# Patient Record
Sex: Male | Born: 1975 | Race: White | Hispanic: No | Marital: Married | Smoking: Never smoker
Health system: Southern US, Community
[De-identification: ages and names within clinical notes are randomized; demographics above are authoritative.]

## PROBLEM LIST (undated history)

## (undated) DIAGNOSIS — G4730 Sleep apnea, unspecified: Secondary | ICD-10-CM

## (undated) HISTORY — PX: DENTAL SURGERY: SHX609

---

## 2000-05-20 ENCOUNTER — Encounter: Payer: Self-pay | Admitting: Emergency Medicine

## 2000-05-20 ENCOUNTER — Emergency Department (HOSPITAL_COMMUNITY): Admission: EM | Admit: 2000-05-20 | Discharge: 2000-05-20 | Payer: Self-pay | Admitting: Emergency Medicine

## 2007-11-14 ENCOUNTER — Emergency Department (HOSPITAL_COMMUNITY): Admission: EM | Admit: 2007-11-14 | Discharge: 2007-11-14 | Payer: Self-pay | Admitting: Emergency Medicine

## 2008-09-05 ENCOUNTER — Emergency Department (HOSPITAL_COMMUNITY): Admission: EM | Admit: 2008-09-05 | Discharge: 2008-09-05 | Payer: Self-pay | Admitting: Emergency Medicine

## 2008-10-04 ENCOUNTER — Emergency Department (HOSPITAL_COMMUNITY): Admission: EM | Admit: 2008-10-04 | Discharge: 2008-10-04 | Payer: Self-pay | Admitting: Emergency Medicine

## 2009-08-14 ENCOUNTER — Emergency Department (HOSPITAL_COMMUNITY): Admission: EM | Admit: 2009-08-14 | Discharge: 2009-08-14 | Payer: Self-pay | Admitting: Emergency Medicine

## 2009-08-29 IMAGING — CR DG FOOT COMPLETE 3+V*L*
3 series · 3 of 3 positions shown · non-contrast
Comparison: None available.

CLINICAL DATA: Pain.  Second toe boil.

LEFT FOOT - COMPLETE 3+ VIEW

[view not recorded (1 of 3)]
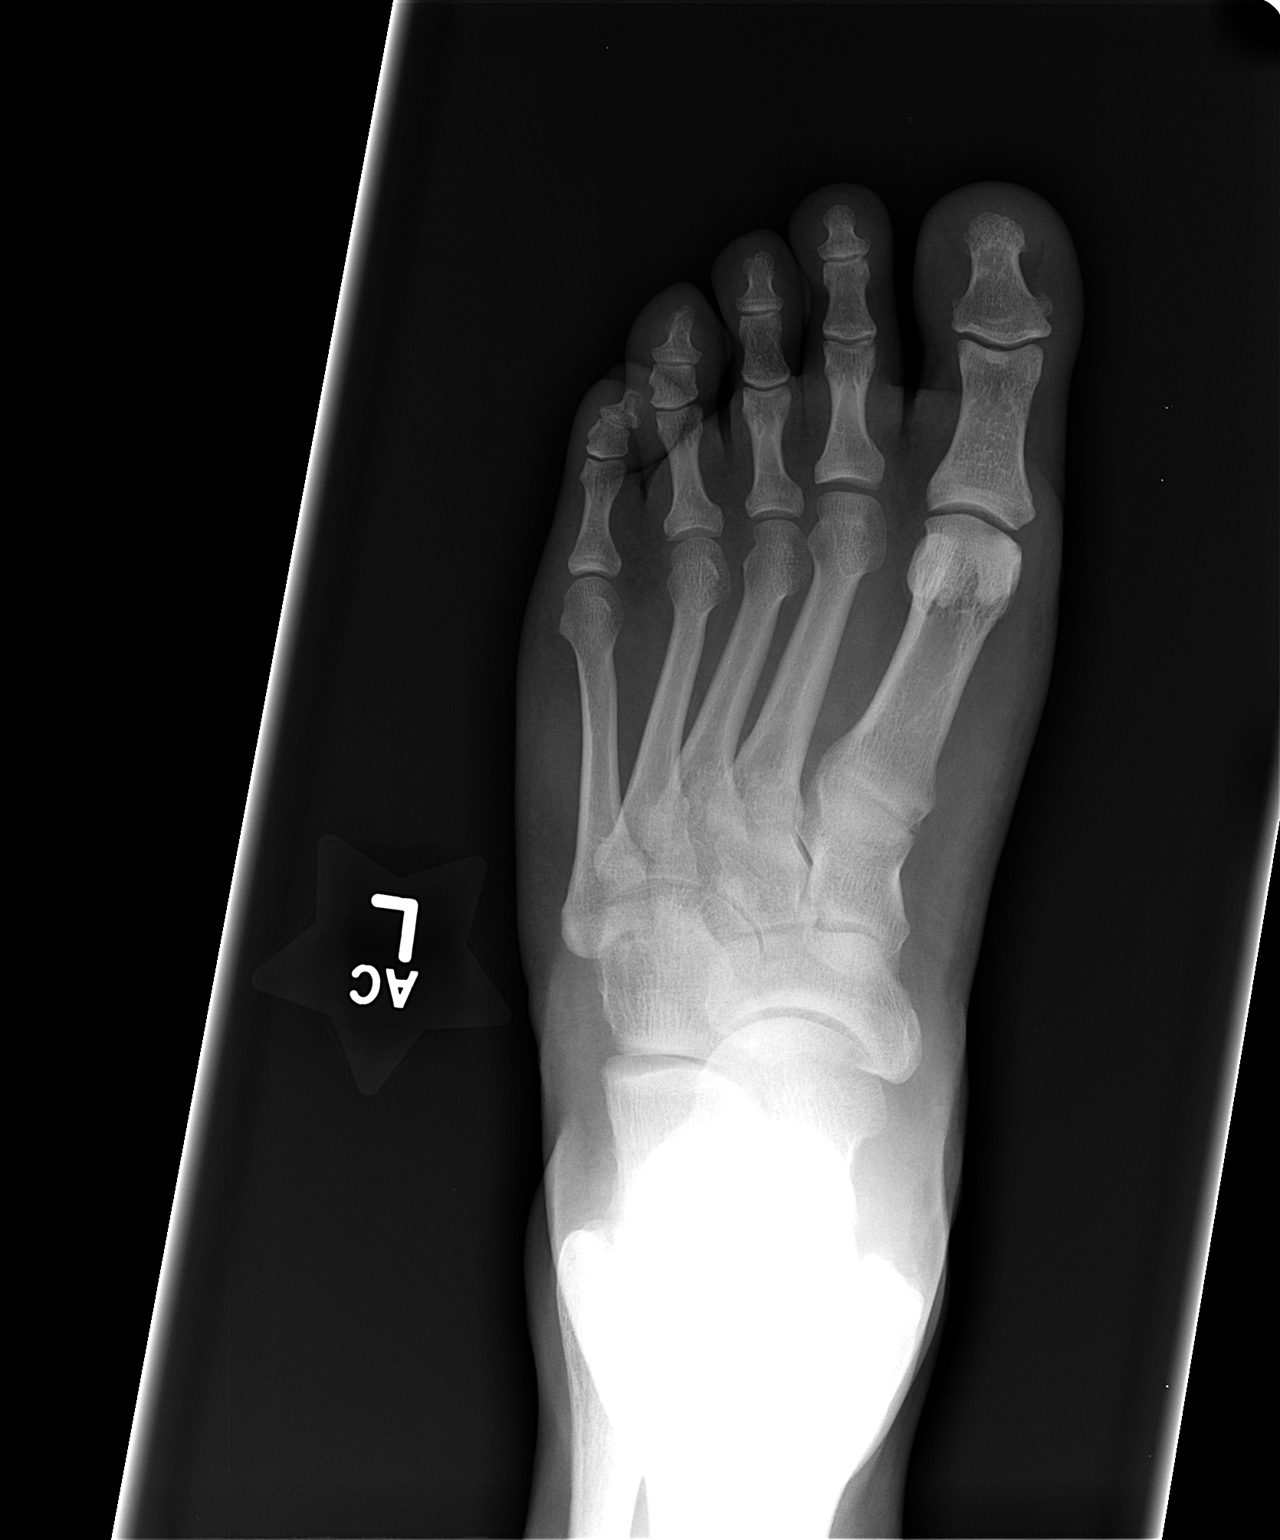

[view not recorded (2 of 3)]
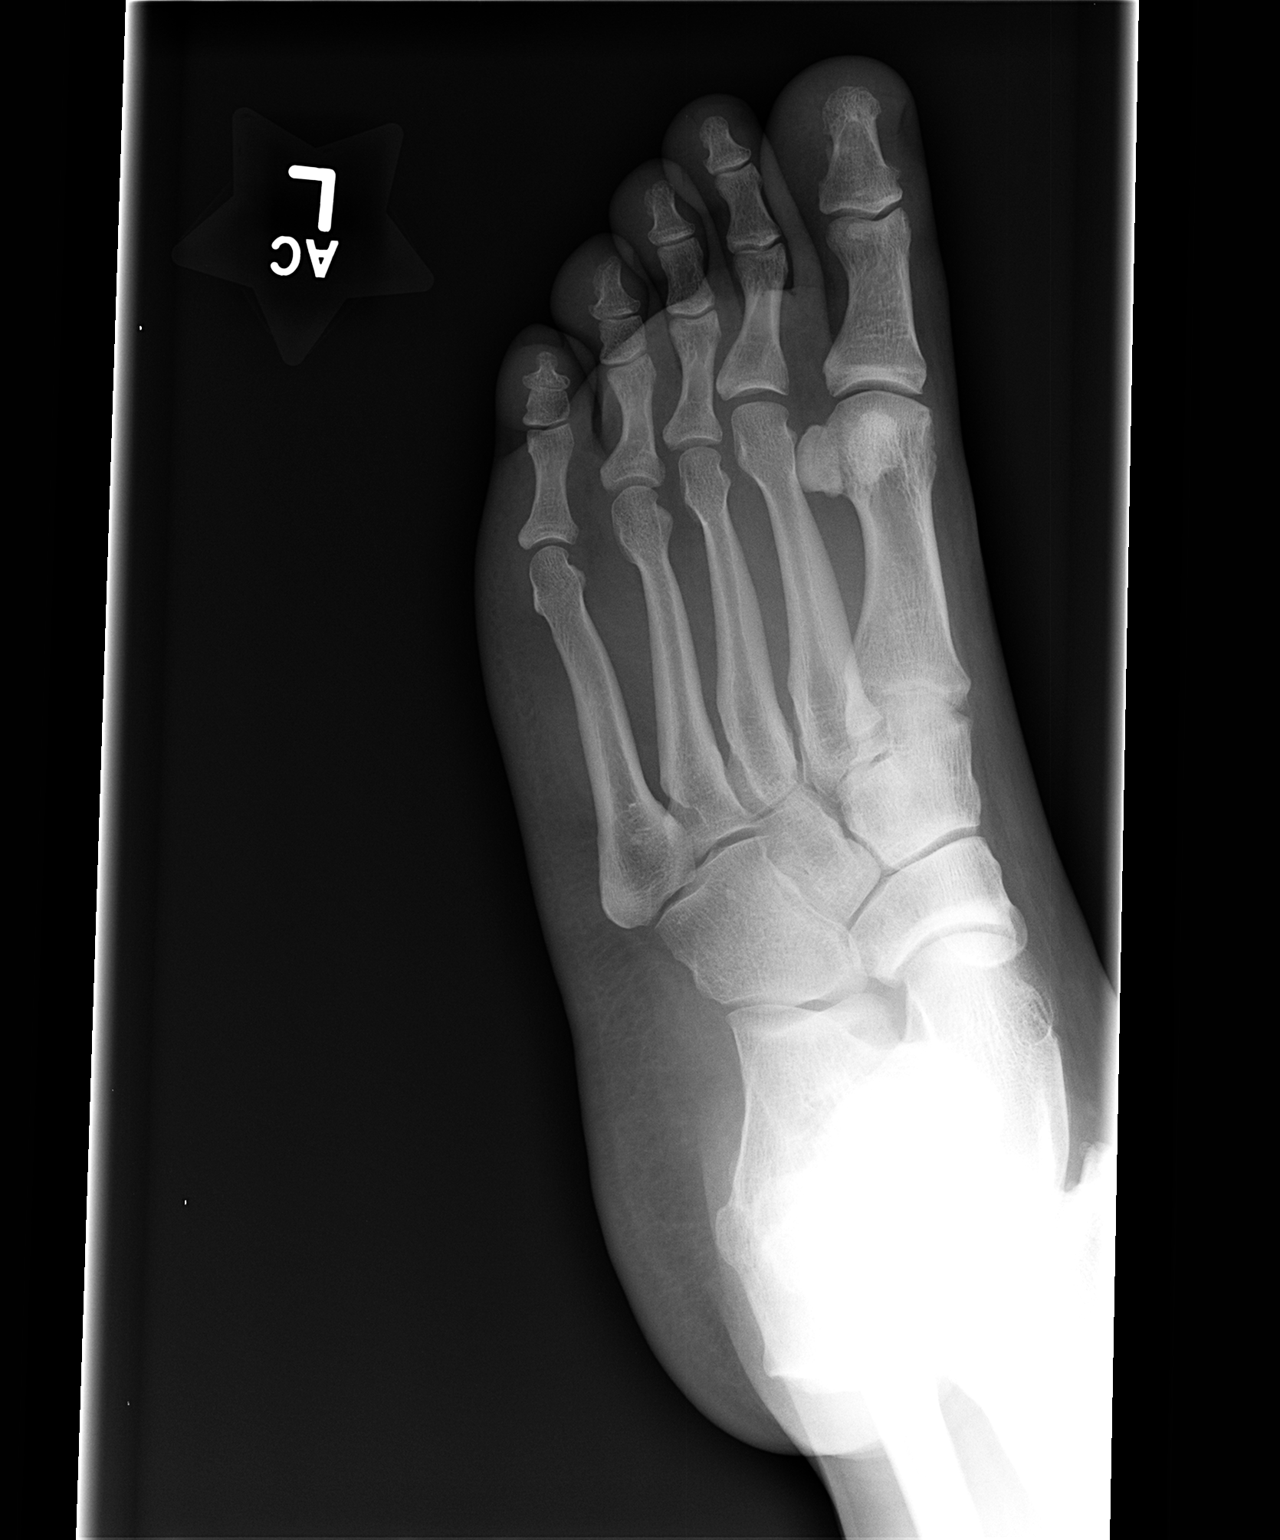

[view not recorded (3 of 3)]
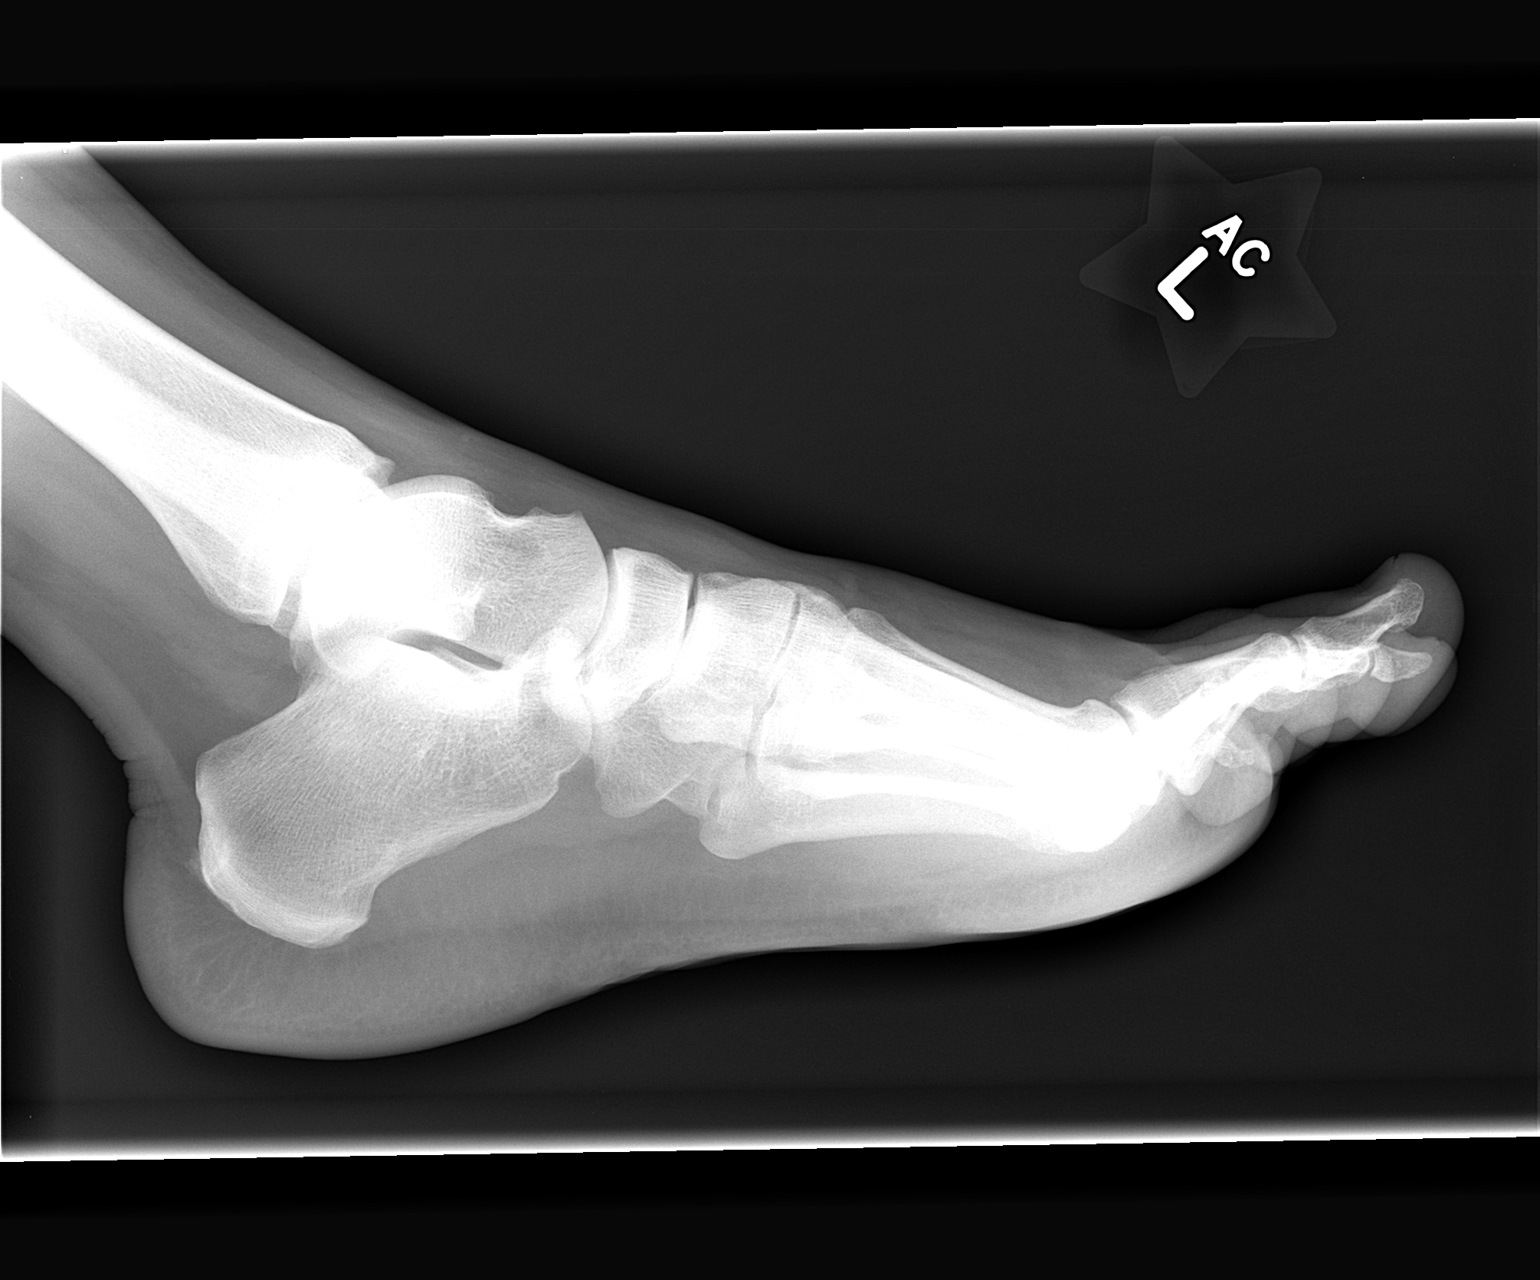

[3 of 3 positions shown; findings below may reference images not displayed]

FINDINGS: There is soft tissue swelling within the distal aspect of
the second digit.  No osseous abnormality is seen.  The remainder
of the foot is unremarkable.
IMPRESSION: 1.  Mild soft tissue swelling within the distal aspect of the
second toe without underlying osseous abnormality.
2.  No other acute abnormality.

## 2011-02-10 LAB — URINALYSIS, ROUTINE W REFLEX MICROSCOPIC
Hgb urine dipstick: NEGATIVE
Ketones, ur: NEGATIVE mg/dL
Protein, ur: NEGATIVE mg/dL
Urobilinogen, UA: 0.2 mg/dL (ref 0.0–1.0)

## 2011-02-10 LAB — RAPID STREP SCREEN (MED CTR MEBANE ONLY): Streptococcus, Group A Screen (Direct): NEGATIVE

## 2011-07-28 LAB — COMPREHENSIVE METABOLIC PANEL
Albumin: 4
BUN: 15
Calcium: 9.6
Creatinine, Ser: 0.92
Glucose, Bld: 93
Potassium: 4.6
Total Protein: 7

## 2011-07-28 LAB — DIFFERENTIAL
Lymphocytes Relative: 41
Lymphs Abs: 3.9
Monocytes Absolute: 0.9
Monocytes Relative: 9
Neutro Abs: 4.6
Neutrophils Relative %: 48

## 2011-07-28 LAB — URINALYSIS, ROUTINE W REFLEX MICROSCOPIC
Bilirubin Urine: NEGATIVE
Glucose, UA: NEGATIVE
Ketones, ur: NEGATIVE
Protein, ur: NEGATIVE

## 2011-07-28 LAB — CBC
HCT: 45
MCHC: 33.8
MCV: 90.5
Platelets: 277
RDW: 13.4

## 2011-08-08 LAB — STREP A DNA PROBE: Group A Strep Probe: NEGATIVE

## 2011-08-08 LAB — RAPID STREP SCREEN (MED CTR MEBANE ONLY): Streptococcus, Group A Screen (Direct): NEGATIVE

## 2011-09-21 ENCOUNTER — Ambulatory Visit: Payer: Self-pay | Admitting: Ophthalmology

## 2011-10-26 ENCOUNTER — Ambulatory Visit: Payer: Self-pay | Admitting: Ophthalmology

## 2013-03-14 ENCOUNTER — Other Ambulatory Visit (HOSPITAL_COMMUNITY): Payer: Self-pay | Admitting: Internal Medicine

## 2013-03-14 DIAGNOSIS — M545 Low back pain, unspecified: Secondary | ICD-10-CM

## 2013-03-14 DIAGNOSIS — N508 Other specified disorders of male genital organs: Secondary | ICD-10-CM

## 2013-03-15 ENCOUNTER — Other Ambulatory Visit (HOSPITAL_COMMUNITY): Payer: Self-pay | Admitting: Internal Medicine

## 2013-03-15 ENCOUNTER — Ambulatory Visit (HOSPITAL_COMMUNITY)
Admission: RE | Admit: 2013-03-15 | Discharge: 2013-03-15 | Disposition: A | Discharge status: Home / Self Care | Payer: BC Managed Care – PPO | Source: Ambulatory Visit | Attending: Internal Medicine | Admitting: Internal Medicine

## 2013-03-15 DIAGNOSIS — N508 Other specified disorders of male genital organs: Secondary | ICD-10-CM

## 2013-03-15 DIAGNOSIS — M545 Low back pain, unspecified: Secondary | ICD-10-CM

## 2013-03-15 DIAGNOSIS — N509 Disorder of male genital organs, unspecified: Secondary | ICD-10-CM | POA: Insufficient documentation

## 2013-03-15 DIAGNOSIS — N433 Hydrocele, unspecified: Secondary | ICD-10-CM | POA: Insufficient documentation

## 2014-02-07 IMAGING — US US SCROTUM
1 series · 14 of 25 positions shown · non-contrast
Comparison: None.

CLINICAL DATA: Left testicular pain, popping sensation

SCROTAL ULTRASOUND
DOPPLER ULTRASOUND OF THE TESTICLES
TECHNIQUE: Complete ultrasound examination of the testicles,
epididymis, and other scrotal structures was performed.  Color and
spectral Doppler ultrasound were also utilized to evaluate blood
flow to the testicles.

[Series 1: us scrotum · 0.07mm/px · 14 of 63 slices shown]
[im 1/63]
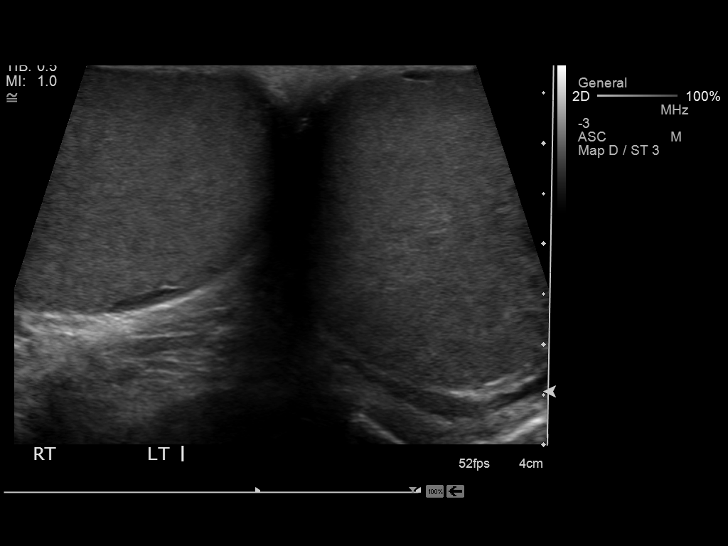
[im 6/63]
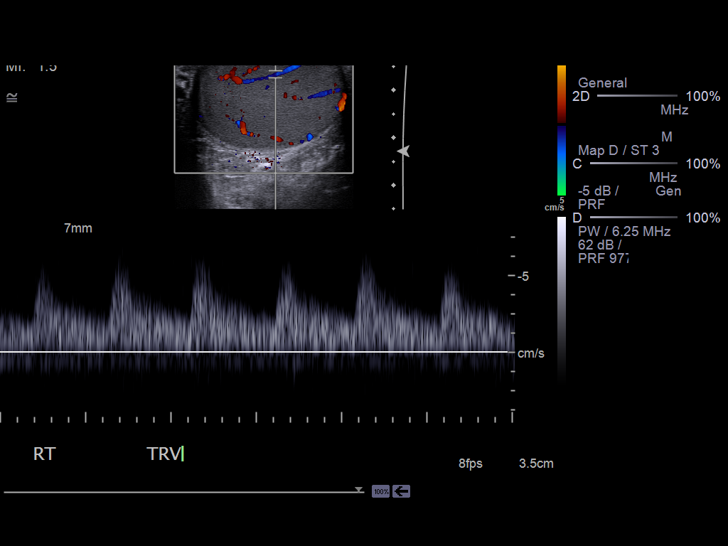
[im 11/63]
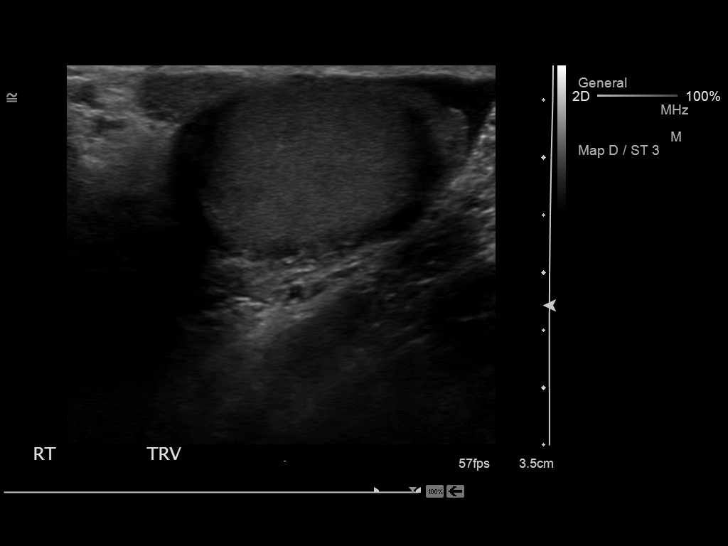
[im 16/63]
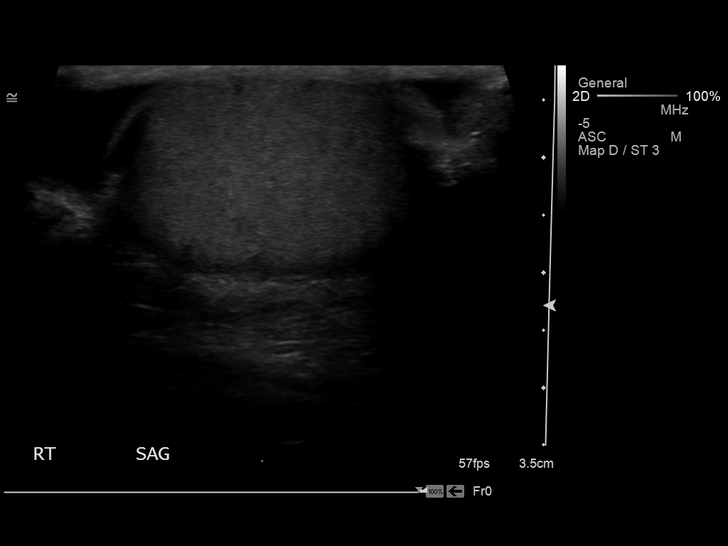
[im 21/63]
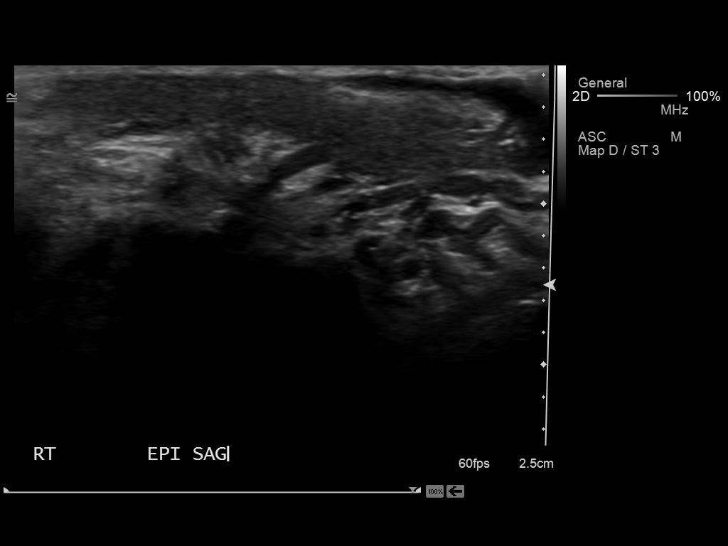
[im 24/63]
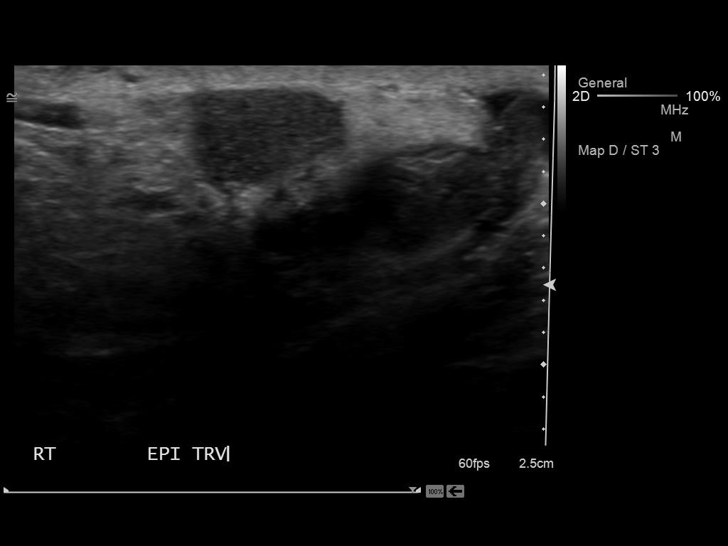
[im 29/63]
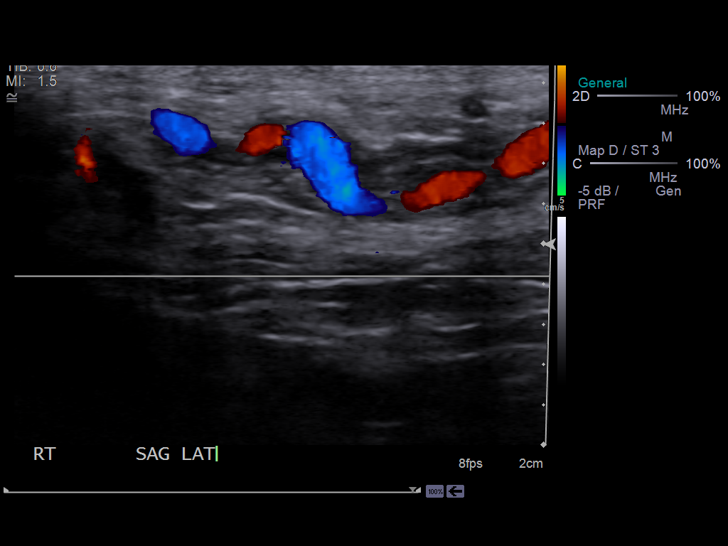
[im 34/63]
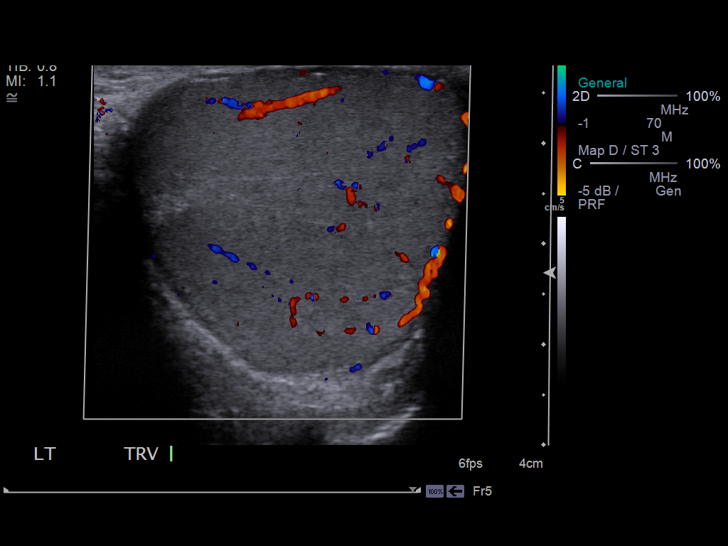
[im 39/63]
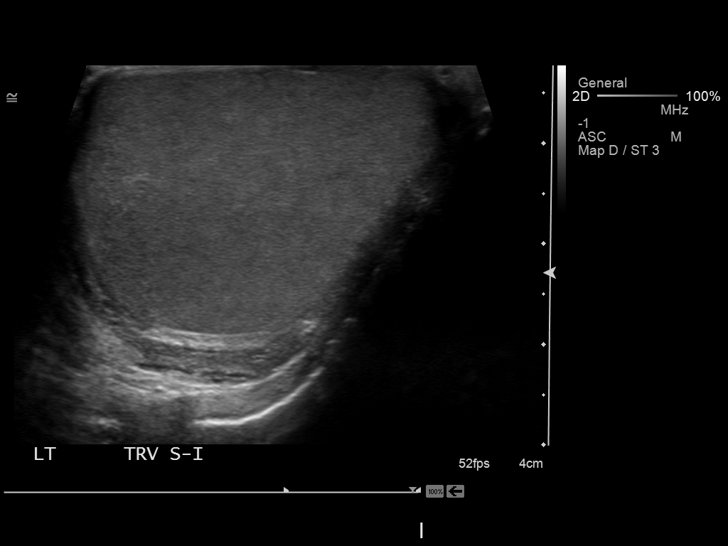
[im 42/63]
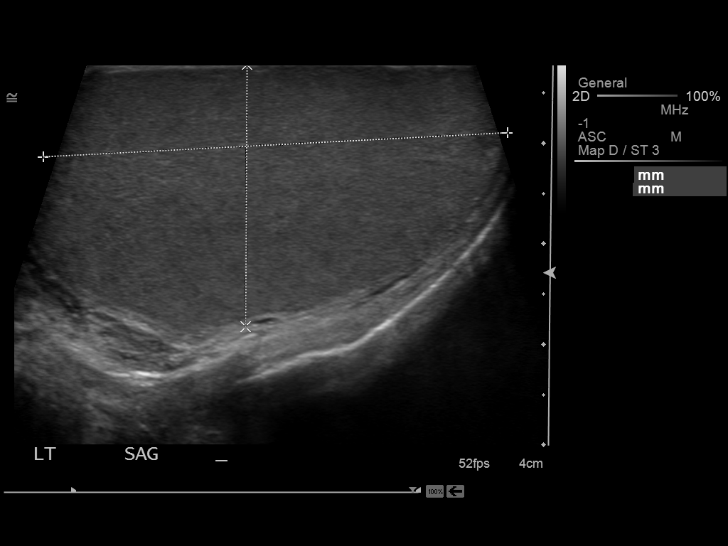
[im 47/63]
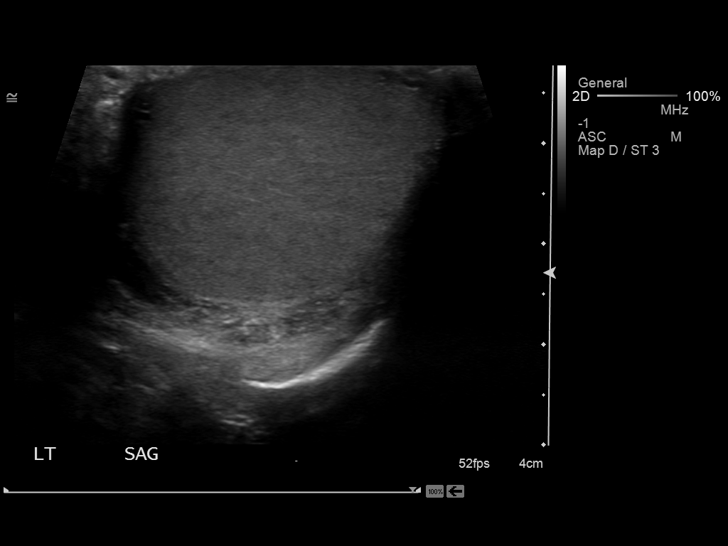
[im 52/63]
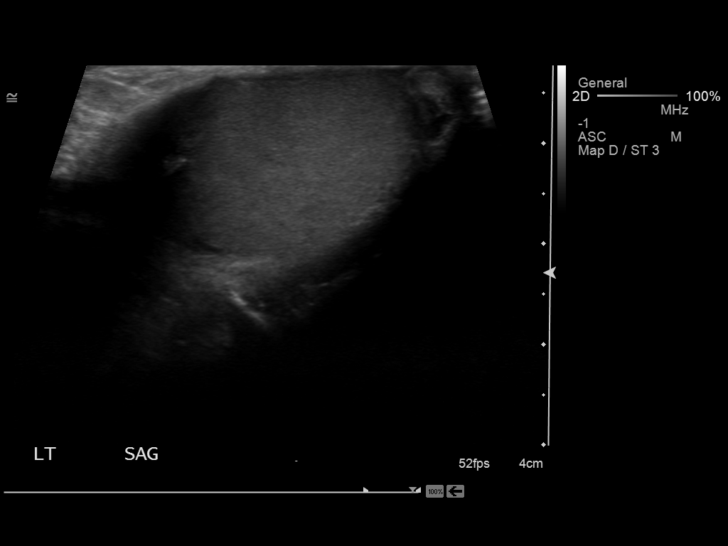
[im 57/63]
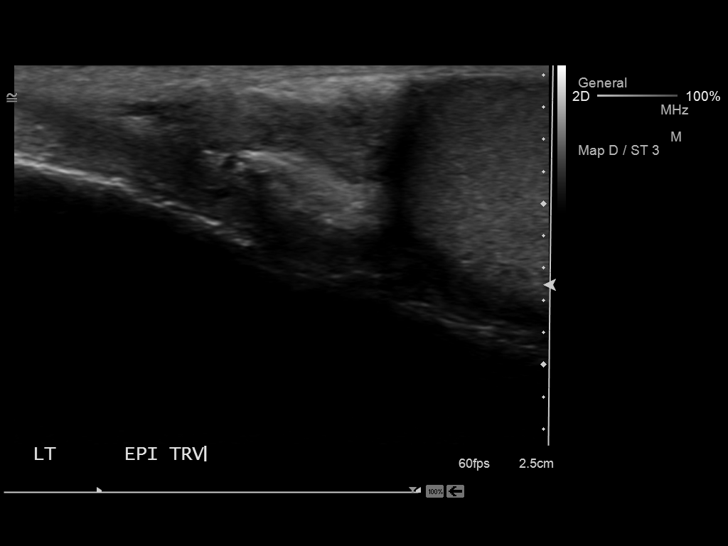
[im 63/63]
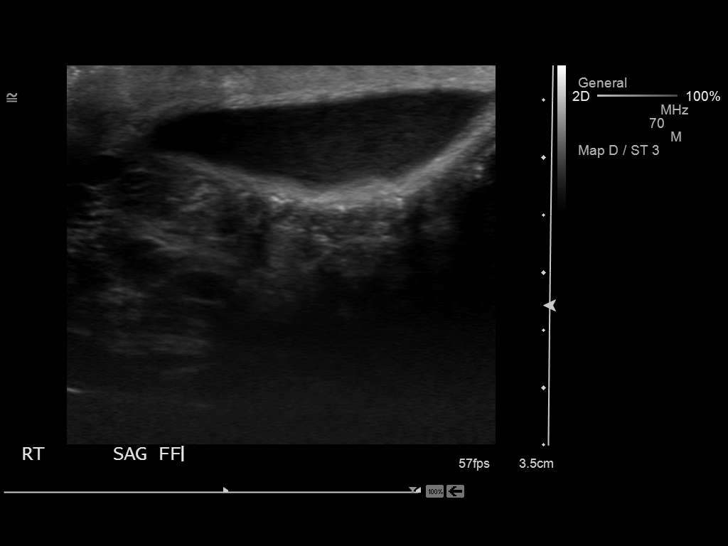

[14 of 25 positions shown; findings below may reference images not displayed]

FINDINGS: Right testis:  Measures 4.6 x 2.5 x 3.5 cm.  Normal homogeneous
echotexture.  Normal color Doppler flow with arterial and venous
waveforms detected.  No focal mass or abnormality.

Left testis:  Measures 4.6 x 2.6 x 3.3 cm.  Normal homogeneous
echotexture.  No focal mass or abnormality.  Normal color Doppler
flow with arterial and venous waveforms detected.

Right epididymis:  Normal in size and appearance.

Left epididymis:  Normal in size and appearance.

Hydrocele:  Very small bilateral hydroceles.

Varicocele:  Not detected

Pulsed Doppler interrogation of both testes demonstrates low
resistance flow bilaterally.
IMPRESSION: Normal scrotal ultrasound and Doppler evaluation.  Negative for
torsion.

## 2014-04-14 ENCOUNTER — Ambulatory Visit (HOSPITAL_COMMUNITY)
Admission: RE | Admit: 2014-04-14 | Discharge: 2014-04-14 | Disposition: A | Discharge status: Home / Self Care | Payer: BC Managed Care – PPO | Source: Ambulatory Visit | Attending: Family Medicine | Admitting: Family Medicine

## 2014-04-14 ENCOUNTER — Other Ambulatory Visit (HOSPITAL_COMMUNITY): Payer: Self-pay | Admitting: Family Medicine

## 2014-04-14 DIAGNOSIS — M7730 Calcaneal spur, unspecified foot: Secondary | ICD-10-CM | POA: Insufficient documentation

## 2014-04-14 DIAGNOSIS — M25579 Pain in unspecified ankle and joints of unspecified foot: Secondary | ICD-10-CM

## 2014-04-14 DIAGNOSIS — M25473 Effusion, unspecified ankle: Secondary | ICD-10-CM | POA: Insufficient documentation

## 2014-04-14 DIAGNOSIS — M25476 Effusion, unspecified foot: Principal | ICD-10-CM | POA: Insufficient documentation

## 2015-03-01 NOTE — Op Note (Signed)
PATIENT NAME:  Spencer Bowman, Spencer Bowman MR#:  [id number] DATE OF BIRTH:  02/04/1976  DATE OF PROCEDURE:  10/26/2011  PROCEDURE PERFORMED: Scleral excision of the right eye.   PREOPERATIVE DIAGNOSIS: Primary uveal effusion syndrome.   POSTOPERATIVE DIAGNOSIS: Primary uveal effusion syndrome.   ESTIMATED BLOOD LOSS: Less than 1 mL.  PRIMARY SURGEON: Ignacia FellingMatthew F. Asheton Viramontes, MD   ANESTHESIA: Retrobulbar block of the right eye with monitored anesthesia care.   COMPLICATIONS: None.   INDICATIONS FOR PROCEDURE: This is a patient who presented to the office with slowly decreasing vision over several years. The patient was noted to have findings in the back of the eye consistent with primary uveal effusion this combined with a smaller eye and thickened sclera. Risks, benefits, and alternatives of the above procedure were discussed and the patient wished to proceed.    DETAILS OF PROCEDURE: After informed consent was obtained, the patient was brought into the operative suite at [facility]. The patient was placed in supine position, was given a small dose of propofol and a retrobulbar block was performed on the right eye by the primary surgeon without any complications. Right eye was prepped and draped in a sterile manner. After lid speculum was inserted, a 360 degree conjunctival peritomy was performed. Tenon's capsule was incised off of the globe in all four quadrants. Each of the four rectus muscles were isolated using 4-0 silks. Each of the four quadrants were then given a partial thickness scleral window using a Beaver blade. Each of the windows was approximately 3 x 3.5 mm. Once each of the scleral windows were completed and hemostasis was confirmed, each of the bridle sutures were removed. The conjunctiva was then brought up and the conjunctiva was closed using running 6-0 plain gut suture. The lid speculum was removed and the eye was cleaned and TobraDex was placed in the eye.   A patch  and shield were placed over the eye and the patient was taken to postanesthesia care with instructions to remain head up.  ____________________________ Ignacia FellingMatthew F. Champ MungoAppenzeller, MD mfa:drc D: 10/26/2011 12:33:32 ET T: 10/26/2011 13:02:57 ET JOB#: [id number]  cc: Ignacia FellingMatthew F. Champ MungoAppenzeller, MD, <Dictator> Cline CoolsMATTHEW F Dariush Mcnellis MD ELECTRONICALLY SIGNED 11/11/2011 17:42

## 2016-09-02 ENCOUNTER — Encounter (HOSPITAL_COMMUNITY): Payer: Self-pay | Admitting: Emergency Medicine

## 2016-09-02 ENCOUNTER — Emergency Department (HOSPITAL_COMMUNITY)
Admission: EM | Admit: 2016-09-02 | Discharge: 2016-09-02 | Disposition: A | Discharge status: Home / Self Care | Payer: BLUE CROSS/BLUE SHIELD | Attending: Emergency Medicine | Admitting: Emergency Medicine

## 2016-09-02 DIAGNOSIS — W540XXA Bitten by dog, initial encounter: Secondary | ICD-10-CM | POA: Diagnosis not present

## 2016-09-02 DIAGNOSIS — Y939 Activity, unspecified: Secondary | ICD-10-CM | POA: Insufficient documentation

## 2016-09-02 DIAGNOSIS — Z79899 Other long term (current) drug therapy: Secondary | ICD-10-CM | POA: Insufficient documentation

## 2016-09-02 DIAGNOSIS — Z792 Long term (current) use of antibiotics: Secondary | ICD-10-CM | POA: Insufficient documentation

## 2016-09-02 DIAGNOSIS — Y929 Unspecified place or not applicable: Secondary | ICD-10-CM | POA: Diagnosis not present

## 2016-09-02 DIAGNOSIS — Y999 Unspecified external cause status: Secondary | ICD-10-CM | POA: Diagnosis not present

## 2016-09-02 DIAGNOSIS — S61252A Open bite of right middle finger without damage to nail, initial encounter: Secondary | ICD-10-CM | POA: Diagnosis not present

## 2016-09-02 MED ORDER — AMOXICILLIN-POT CLAVULANATE 875-125 MG PO TABS: 1.0000 | ORAL_TABLET | Freq: Two times a day (BID) | ORAL | 0 refills | Status: DC

## 2016-09-02 NOTE — ED Triage Notes (Signed)
Pt reports animal bite to left middle finger. Animal control aware and has quarantined dog. The dog is not up to date on shots according to owners.

## 2016-09-02 NOTE — ED Notes (Signed)
Spoke with Minerva AreolaEric from [employer] [place], dog is quarantined and there is no proof of vaccines.

## 2016-09-02 NOTE — Discharge Instructions (Signed)
Clean the wound with mild soap and water.  Return here for any signs of infection such as increasing pain, redness or swelling

## 2016-09-06 NOTE — ED Provider Notes (Signed)
[facility]-EMER[facility]Y DEPT Provider Note   CSN: [id number] Arrival date & time: 09/02/16  1412     History   Chief Complaint Chief Complaint  Patient presents with  . Animal Bite    HPI Spencer GourdMatthew M Cloninger is a 40 y.o. male.  HPI  Spencer Bowman is a 40 y.o. male who presents to the Emergency Department requesting evaluation for a dog bite that occurred one day prior to arrival.  Patient states that he and his son were trying to separate two dogs that were fighting and he was bitten on the left middle finger.  Wounds were cleaned with peroxide and water.  Greater than 24 hrs old.  Immunizations are current. He states the dog is currently quarantined. He denies redness, swelling and numbness of the finger    History reviewed. No pertinent past medical history.  There are no active problems to display for this patient.   Past Surgical History:  Procedure Laterality Date  . DENTAL SURGERY         Home Medications    Prior to Admission medications   Medication Sig Start Date End Date Taking? Authorizing Provider  Multiple Vitamin (ONE-A-DAY MENS PO) Take 1 tablet by mouth daily.   Yes Historical Provider, MD  amoxicillin-clavulanate (AUGMENTIN) 875-125 MG tablet Take 1 tablet by mouth every 12 (twelve) hours. 09/02/16   Arlo Butt, PA-C    Family History No family history on file.  Social History Social History  Substance Use Topics  . Smoking status: Never Smoker  . Smokeless tobacco: Never Used  . Alcohol use No     Allergies   Review of patient's allergies indicates no known allergies.   Review of Systems Review of Systems  Constitutional: Negative for chills and fever.  Musculoskeletal: Negative for arthralgias, back pain and joint swelling.  Skin: Positive for wound.       Dog bite to left middle finger  Neurological: Negative for dizziness, weakness and numbness.  Hematological: Does not bruise/bleed easily.  All other systems reviewed and are  negative.    Physical Exam Updated Vital Signs BP (!) 166/111 (BP Location: Left Arm)   Pulse 82   Temp 97.9 F (36.6 C) (Oral)   Resp 20   Ht 5\' 9"  (1.753 m)   Wt 122.5 kg   SpO2 97%   BMI 39.87 kg/m   Physical Exam  Constitutional: He is oriented to person, place, and time. He appears well-developed and well-nourished. No distress.  HENT:  Head: Atraumatic.  Cardiovascular: Normal rate, regular rhythm and intact distal pulses.   Pulmonary/Chest: Effort normal. No respiratory distress.  Musculoskeletal: Normal range of motion. He exhibits tenderness. He exhibits no edema or deformity.  superificial 1.5 cm wound to the left distal middle finger.  No erythema, edema, lymphangitis.  Pt has FROM of the finger.  Sensation intact  Neurological: He is alert and oriented to person, place, and time.  Skin: Skin is warm and dry. Capillary refill takes less than 2 seconds. No rash noted. No erythema.  Nursing note and vitals reviewed.    ED Treatments / Results  Labs (all labs ordered are listed, but only abnormal results are displayed) Labs Reviewed - No data to display  EKG  EKG Interpretation None       Radiology No results found.  Procedures Procedures (including critical care time)  Medications Ordered in ED Medications - No data to display   Initial Impression / Assessment and Plan / ED Course  I have reviewed the triage vital signs and the nursing notes.  Pertinent labs & imaging results that were available during my care of the patient were reviewed by me and considered in my medical decision making (see chart for details).  Clinical Course   Superficial wound to the left middle finger.  NV and motor intact  No signs of infection currently.  Dog is quarantined and this was verified by Minerva AreolaEric, [employer] officer with [employer].  Pt understands that rabies vaccines are not indicated at present.    Td is up to date.  Final Clinical  Impressions(s) / ED Diagnoses   Final diagnoses:  Dog bite, initial encounter    New Prescriptions Discharge Medication List as of 09/02/2016  4:26 PM    START taking these medications   Details  amoxicillin-clavulanate (AUGMENTIN) 875-125 MG tablet Take 1 tablet by mouth every 12 (twelve) hours., Starting Fri 09/02/2016, Print         Aariel Ems Amherstriplett, PA-C 09/06/16 [id number]    Lavera Guiseana Duo Liu, MD 09/07/16 [id number]

## 2016-12-28 ENCOUNTER — Ambulatory Visit: Payer: Self-pay | Admitting: Otolaryngology

## 2016-12-28 NOTE — H&P (Signed)
PREOPERATIVE H&P  Chief Complaint: obstructive sleep apnea  HPI: Spencer Bowman is a 41 y.o. male who presents for evaluation of OSA. He's always snored but more recently his wife states he stops breathing at night. Sleep test demonstrated severe sleep apnea with RDI of 48 and O2 sat down to mid 70's. He refuses to wear nasal CPAP and wants to try surgery and lose weight although I feel like nasal CPAP would be more effective. He does have moderate septal deviation and large turbinates as well as still having tonsils and a long uvula. Surgery will be beneficial to him. He would like to proceed with the surgery.  No past medical history on file. Past Surgical History:  Procedure Laterality Date  . DENTAL SURGERY     Social History   Social History  . Marital status: Married    Spouse name: N/A  . Number of children: N/A  . Years of education: N/A   Social History Main Topics  . Smoking status: Never Smoker  . Smokeless tobacco: Never Used  . Alcohol use No  . Drug use: No  . Sexual activity: Not on file   Other Topics Concern  . Not on file   Social History Narrative  . No narrative on file   No family history on file. No Known Allergies Prior to Admission medications   Medication Sig Start Date End Date Taking? Authorizing Provider  Omega-3 Fatty Acids (FISH OIL) 1000 MG CAPS Take 1,000 mg by mouth 2 (two) times daily.   Yes Historical Provider, MD  amoxicillin-clavulanate (AUGMENTIN) 875-125 MG tablet Take 1 tablet by mouth every 12 (twelve) hours. Patient not taking: Reported on 12/26/2016 09/02/16   Tammy Triplett, PA-C     Positive ROS: per HPI  All other systems have been reviewed and were otherwise negative with the exception of those mentioned in the HPI and as above.  Physical Exam: There were no vitals filed for this visit.  General: Alert, no acute distress Oral: Normal oral mucosa and tonsils, long uvula, slightly retrognathic Nasal: deviated septum and  large turbinates, no polyps or masses Neck: No palpable adenopathy or thyroid nodules Ear: Ear canal is clear with normal appearing TMs Cardiovascular: Regular rate and rhythm, no murmur.  Respiratory: Clear to auscultation Neurologic: Alert and oriented x 3   Assessment/Plan: obstructive sleep apnea Plan for Procedure(s): NASAL SEPTOPLASTY WITH BILATERAL TURBINATE REDUCTION UVULOPALATOPHARYNGOPLASTY (UPPP)   Dillard CannonHRISTOPHER Elam Ellis, MD 12/28/2016 12:55 PM

## 2016-12-30 ENCOUNTER — Encounter (HOSPITAL_COMMUNITY): Payer: Self-pay

## 2016-12-30 ENCOUNTER — Encounter (HOSPITAL_COMMUNITY)
Admission: RE | Admit: 2016-12-30 | Discharge: 2016-12-30 | Disposition: A | Discharge status: Home / Self Care | Payer: BLUE CROSS/BLUE SHIELD | Source: Ambulatory Visit | Attending: Otolaryngology | Admitting: Otolaryngology

## 2016-12-30 DIAGNOSIS — R0683 Snoring: Secondary | ICD-10-CM | POA: Insufficient documentation

## 2016-12-30 DIAGNOSIS — Z01812 Encounter for preprocedural laboratory examination: Secondary | ICD-10-CM | POA: Insufficient documentation

## 2016-12-30 DIAGNOSIS — G4733 Obstructive sleep apnea (adult) (pediatric): Secondary | ICD-10-CM | POA: Diagnosis not present

## 2016-12-30 HISTORY — DX: Sleep apnea, unspecified: G47.30

## 2016-12-30 LAB — CBC
HCT: 47.4 % (ref 39.0–52.0)
Hemoglobin: 16.7 g/dL (ref 13.0–17.0)
MCH: 31.9 pg (ref 26.0–34.0)
MCHC: 35.2 g/dL (ref 30.0–36.0)
MCV: 90.5 fL (ref 78.0–100.0)
PLATELETS: 325 10*3/uL (ref 150–400)
RBC: 5.24 MIL/uL (ref 4.22–5.81)
RDW: 12.4 % (ref 11.5–15.5)
WBC: 10.7 10*3/uL — ABNORMAL HIGH (ref 4.0–10.5)

## 2016-12-30 NOTE — Pre-Procedure Instructions (Addendum)
Spencer SellaMatthew M Bowman  12/30/2016      [facility] - [place] - [address] [place] [id number] Phone: [phone] Fax: [phone]    Your procedure is scheduled on *01/06/17  [facility] Admitting at 530 A.M.  Call this number if you have problems the morning of surgery:  [phone]   Remember:  Do not eat food or drink liquids after midnight.  Take these medicines the morning of surgery with A SIP OF WATER    NONE  STOP all herbel meds, nsaids (aleve,naproxen,advil,ibuprofen)  Starting Today including all vitamins/supplements(fish oil),aspirin   Do not wear jewelry, make-up or nail polish.  Do not wear lotions, powders, or perfumes, or deoderant.  Do not shave 48 hours prior to surgery.  Men may shave face and neck.  Do not bring valuables to the hospital.  [facility] is not responsible for any belongings or valuables.  Contacts, dentures or bridgework may not be worn into surgery.  Leave your suitcase in the car.  After surgery it may be brought to your room.  For patients admitted to the hospital, discharge time will be determined by your treatment team.  Patients discharged the day of surgery will not be allowed to drive home.   Special instructions:   Special Instructions: [name] - Preparing for Surgery  Before surgery, you can play an important role.  Because skin is not sterile, your skin needs to be as free of germs as possible.  You can reduce the number of germs on you skin by washing with CHG (chlorahexidine gluconate) soap before surgery.  CHG is an antiseptic cleaner which kills germs and [place] with the skin to continue killing germs even after washing.  Please DO NOT use if you have an allergy to CHG or antibacterial soaps.  If your skin becomes reddened/irritated stop using the CHG and inform your nurse when you arrive at Short Stay.  Do not shave (including legs and underarms) for at least 48  hours prior to the first CHG shower.  You may shave your face.  Please follow these instructions carefully:   1.  Shower with CHG Soap the night before surgery and the morning of Surgery.  2.  If you choose to wash your hair, wash your hair first as usual with your normal shampoo.  3.  After you shampoo, rinse your hair and body thoroughly to remove the Shampoo.  4.  Use CHG as you would any other liquid soap.  You can apply chg directly  to the skin and wash gently with scrungie or a clean washcloth.  5.  Apply the CHG Soap to your body ONLY FROM THE NECK DOWN.  Do not use on open wounds or open sores.  Avoid contact with your eyes ears, mouth and genitals (private parts).  Wash genitals (private parts)       with your normal soap.  6.  Wash thoroughly, paying special attention to the area where your surgery will be performed.  7.  Thoroughly rinse your body with warm water from the neck down.  8.  DO NOT shower/wash with your normal soap after using and rinsing off the CHG Soap.  9.  Pat yourself dry with a clean towel.            10.  Wear clean pajamas.            11.  Place  clean sheets on your bed the night of your first shower and do not sleep with pets.  Day of Surgery  Do not apply any lotions/deodorants the morning of surgery.  Please wear clean clothes to the hospital/surgery center.  Please read over the  fact sheets that you were given.

## 2016-12-30 NOTE — Pre-Procedure Instructions (Signed)
Spencer Bowman  12/30/2016      [facility] - [place], [place] - [address] [place] [address] Phone: [phone] Fax: [phone]    Your procedure is scheduled on Fri. Mar. 2  Report to [facility] Admitting at 5:30 A.M.  Call this number if you have problems the morning of surgery:  [phone]   Remember:  Do not eat food or drink liquids after midnight.  Take these medicines the morning of surgery with A SIP OF WATER  : none              1 week prior to surgery stop advil, motrin, aleve, ibuprofen, BC Powders, goody's, fish oil vitamins/herbal medicines.   Do not wear jewelry.  Do not wear lotions, powders, or cologne, or deoderant.  Do not shave 48 hours prior to surgery.  Men may shave face and neck.  Do not bring valuables to the hospital.  [facility] is not responsible for any belongings or valuables.  Contacts, dentures or bridgework may not be worn into surgery.  Leave your suitcase in the car.  After surgery it may be brought to your room.  For patients admitted to the hospital, discharge time will be determined by your treatment team.  Patients discharged the day of surgery will not be allowed to drive home.   Name and phone number of your driver:   Special instructions:   [place]- Preparing For Surgery  Before surgery, you can play an important role. Because skin is not sterile, your skin needs to be as free of germs as possible. You can reduce the number of germs on your skin by washing with CHG (chlorahexidine gluconate) Soap before surgery.  CHG is an antiseptic cleaner which kills germs and [place] with the skin to continue killing germs even after washing.  Please do not use if you have an allergy to CHG or antibacterial soaps. If your skin becomes reddened/irritated stop using the CHG.  Do not shave (including legs and underarms) for at least 48 hours prior to first CHG shower. It is OK to shave your  face.  Please follow these instructions carefully.   1. Shower the NIGHT BEFORE SURGERY and the MORNING OF SURGERY with CHG.   2. If you chose to wash your hair, wash your hair first as usual with your normal shampoo.  3. After you shampoo, rinse your hair and body thoroughly to remove the shampoo.  4. Use CHG as you would any other liquid soap. You can apply CHG directly to the skin and wash gently with a scrungie or a clean washcloth.   5. Apply the CHG Soap to your body ONLY FROM THE NECK DOWN.  Do not use on open wounds or open sores. Avoid contact with your eyes, ears, mouth and genitals (private parts). Wash genitals (private parts) with your normal soap.  6. Wash thoroughly, paying special attention to the area where your surgery will be performed.  7. Thoroughly rinse your body with warm water from the neck down.  8. DO NOT shower/wash with your normal soap after using and rinsing off the CHG Soap.  9. Pat yourself dry with a CLEAN TOWEL.   10. Wear CLEAN PAJAMAS   11. Place CLEAN SHEETS on your bed the night of your first shower and DO NOT SLEEP WITH PETS.    Day of Surgery: Do not apply any deodorants/lotions. Please wear clean  clothes to the hospital/surgery center.      Please read over the following fact sheets that you were given. Coughing and Deep Breathing

## 2017-01-05 MED ORDER — DEXTROSE 5 % IV SOLN
3.0000 g | INTRAVENOUS | Status: AC
  Administered 2017-01-06: 3 g via INTRAVENOUS
  Filled 2017-01-05: qty 3000

## 2017-01-06 ENCOUNTER — Ambulatory Visit (HOSPITAL_COMMUNITY): Payer: BLUE CROSS/BLUE SHIELD | Admitting: Certified Registered Nurse Anesthetist

## 2017-01-06 ENCOUNTER — Observation Stay (HOSPITAL_COMMUNITY)
Admission: RE | Admit: 2017-01-06 | Discharge: 2017-01-07 | Disposition: A | Discharge status: Home / Self Care | Payer: BLUE CROSS/BLUE SHIELD | Source: Ambulatory Visit | Attending: Otolaryngology | Admitting: Otolaryngology

## 2017-01-06 ENCOUNTER — Encounter (HOSPITAL_COMMUNITY): Payer: Self-pay

## 2017-01-06 ENCOUNTER — Encounter (HOSPITAL_COMMUNITY)
Admission: RE | Disposition: A | Discharge status: Home / Self Care | Payer: Self-pay | Source: Ambulatory Visit | Attending: Otolaryngology

## 2017-01-06 DIAGNOSIS — J3489 Other specified disorders of nose and nasal sinuses: Secondary | ICD-10-CM | POA: Diagnosis not present

## 2017-01-06 DIAGNOSIS — Z6839 Body mass index (BMI) 39.0-39.9, adult: Secondary | ICD-10-CM | POA: Diagnosis not present

## 2017-01-06 DIAGNOSIS — G4733 Obstructive sleep apnea (adult) (pediatric): Principal | ICD-10-CM | POA: Diagnosis present

## 2017-01-06 DIAGNOSIS — J343 Hypertrophy of nasal turbinates: Secondary | ICD-10-CM | POA: Diagnosis not present

## 2017-01-06 DIAGNOSIS — J342 Deviated nasal septum: Secondary | ICD-10-CM | POA: Insufficient documentation

## 2017-01-06 HISTORY — PX: NASAL SEPTOPLASTY W/ TURBINOPLASTY: SHX2070

## 2017-01-06 HISTORY — PX: UVULOPALATOPHARYNGOPLASTY: SHX827

## 2017-01-06 SURGERY — SEPTOPLASTY, NOSE, WITH NASAL TURBINATE REDUCTION
Anesthesia: General | Site: Nose

## 2017-01-06 MED ORDER — ONDANSETRON HCL 4 MG PO TABS
4.0000 mg | ORAL_TABLET | ORAL | Status: DC | PRN
Start: 2017-01-06 — End: 2017-01-07

## 2017-01-06 MED ORDER — MIDAZOLAM HCL 5 MG/5ML IJ SOLN
INTRAMUSCULAR | Status: DC | PRN
  Administered 2017-01-06: 2 mg via INTRAVENOUS

## 2017-01-06 MED ORDER — HYDROCODONE-ACETAMINOPHEN 7.5-325 MG/15ML PO SOLN
10.0000 mL | ORAL | Status: DC | PRN
  Administered 2017-01-06 – 2017-01-07 (×5): 15 mL via ORAL
  Filled 2017-01-06 (×5): qty 15

## 2017-01-06 MED ORDER — 0.9 % SODIUM CHLORIDE (POUR BTL) OPTIME
TOPICAL | Status: DC | PRN
  Administered 2017-01-06: 1000 mL

## 2017-01-06 MED ORDER — LIDOCAINE-EPINEPHRINE (PF) 1 %-1:200000 IJ SOLN
INTRAMUSCULAR | Status: DC | PRN
  Administered 2017-01-06: 10 mL

## 2017-01-06 MED ORDER — CEFAZOLIN IN D5W 1 GM/50ML IV SOLN
1.0000 g | Freq: Three times a day (TID) | INTRAVENOUS | Status: DC
  Administered 2017-01-06 – 2017-01-07 (×3): 1 g via INTRAVENOUS
  Filled 2017-01-06 (×4): qty 50

## 2017-01-06 MED ORDER — MORPHINE SULFATE (PF) 2 MG/ML IV SOLN
2.0000 mg | INTRAVENOUS | Status: DC | PRN
  Administered 2017-01-06 (×2): 2 mg via INTRAVENOUS
  Filled 2017-01-06 (×2): qty 1

## 2017-01-06 MED ORDER — KCL IN DEXTROSE-NACL 20-5-0.45 MEQ/L-%-% IV SOLN
INTRAVENOUS | Status: DC
  Administered 2017-01-06: 100 mL/h via INTRAVENOUS
  Administered 2017-01-07: 02:00:00 via INTRAVENOUS
  Filled 2017-01-06 (×3): qty 1000

## 2017-01-06 MED ORDER — BACITRACIN ZINC 500 UNIT/GM EX OINT
TOPICAL_OINTMENT | CUTANEOUS | Status: AC
  Filled 2017-01-06: qty 28.35

## 2017-01-06 MED ORDER — ACETAMINOPHEN 10 MG/ML IV SOLN
INTRAVENOUS | Status: AC
  Filled 2017-01-06: qty 100

## 2017-01-06 MED ORDER — MEPERIDINE HCL 25 MG/ML IJ SOLN: 6.2500 mg | INTRAMUSCULAR | Status: DC | PRN

## 2017-01-06 MED ORDER — OXYMETAZOLINE HCL 0.05 % NA SOLN
NASAL | Status: DC | PRN
  Administered 2017-01-06: 1 via TOPICAL

## 2017-01-06 MED ORDER — IBUPROFEN 100 MG/5ML PO SUSP
400.0000 mg | Freq: Four times a day (QID) | ORAL | Status: DC | PRN
  Filled 2017-01-06: qty 20

## 2017-01-06 MED ORDER — PROPOFOL 10 MG/ML IV BOLUS
INTRAVENOUS | Status: AC
  Filled 2017-01-06: qty 40

## 2017-01-06 MED ORDER — LIDOCAINE HCL (CARDIAC) 20 MG/ML IV SOLN
INTRAVENOUS | Status: DC | PRN
  Administered 2017-01-06: 100 mg via INTRAVENOUS

## 2017-01-06 MED ORDER — LIDOCAINE-EPINEPHRINE (PF) 1 %-1:200000 IJ SOLN
INTRAMUSCULAR | Status: AC
  Filled 2017-01-06: qty 30

## 2017-01-06 MED ORDER — SUGAMMADEX SODIUM 200 MG/2ML IV SOLN
INTRAVENOUS | Status: DC | PRN
  Administered 2017-01-06: 200 mg via INTRAVENOUS

## 2017-01-06 MED ORDER — ACETAMINOPHEN 10 MG/ML IV SOLN
INTRAVENOUS | Status: DC | PRN
Start: 2017-01-06 — End: 2017-01-06
  Administered 2017-01-06: 1000 mg via INTRAVENOUS

## 2017-01-06 MED ORDER — FENTANYL CITRATE (PF) 100 MCG/2ML IJ SOLN
INTRAMUSCULAR | Status: DC | PRN
  Administered 2017-01-06: 100 ug via INTRAVENOUS
  Administered 2017-01-06: 50 ug via INTRAVENOUS
  Administered 2017-01-06: 100 ug via INTRAVENOUS
  Administered 2017-01-06: 50 ug via INTRAVENOUS

## 2017-01-06 MED ORDER — LACTATED RINGERS IV SOLN
INTRAVENOUS | Status: DC | PRN
  Administered 2017-01-06 (×2): via INTRAVENOUS

## 2017-01-06 MED ORDER — ALBUTEROL SULFATE HFA 108 (90 BASE) MCG/ACT IN AERS
INHALATION_SPRAY | RESPIRATORY_TRACT | Status: DC | PRN
Start: 2017-01-06 — End: 2017-01-06
  Administered 2017-01-06: 6 via RESPIRATORY_TRACT

## 2017-01-06 MED ORDER — ONDANSETRON HCL 4 MG/2ML IJ SOLN
INTRAMUSCULAR | Status: DC | PRN
  Administered 2017-01-06: 4 mg via INTRAVENOUS

## 2017-01-06 MED ORDER — ONDANSETRON HCL 4 MG/2ML IJ SOLN: 4.0000 mg | Freq: Once | INTRAMUSCULAR | Status: DC | PRN

## 2017-01-06 MED ORDER — SODIUM CHLORIDE 0.9 % IR SOLN
Status: DC | PRN
  Administered 2017-01-06: 1000 mL

## 2017-01-06 MED ORDER — DEXAMETHASONE SODIUM PHOSPHATE 10 MG/ML IJ SOLN
INTRAMUSCULAR | Status: DC | PRN
  Administered 2017-01-06: 10 mg via INTRAVENOUS

## 2017-01-06 MED ORDER — ONDANSETRON HCL 4 MG/2ML IJ SOLN: 4.0000 mg | INTRAMUSCULAR | Status: DC | PRN

## 2017-01-06 MED ORDER — ROCURONIUM BROMIDE 100 MG/10ML IV SOLN
INTRAVENOUS | Status: DC | PRN
  Administered 2017-01-06: 50 mg via INTRAVENOUS

## 2017-01-06 MED ORDER — BACITRACIN ZINC 500 UNIT/GM EX OINT
TOPICAL_OINTMENT | CUTANEOUS | Status: DC | PRN
  Administered 2017-01-06: 1 via TOPICAL

## 2017-01-06 MED ORDER — DEXMEDETOMIDINE HCL IN NACL 200 MCG/50ML IV SOLN
INTRAVENOUS | Status: DC | PRN
  Administered 2017-01-06: 8 ug via INTRAVENOUS
  Administered 2017-01-06: 12 ug via INTRAVENOUS
  Administered 2017-01-06: 4 ug via INTRAVENOUS
  Administered 2017-01-06 (×2): 8 ug via INTRAVENOUS

## 2017-01-06 MED ORDER — HYDROMORPHONE HCL 1 MG/ML IJ SOLN: 0.2500 mg | INTRAMUSCULAR | Status: DC | PRN

## 2017-01-06 MED ORDER — SUCCINYLCHOLINE CHLORIDE 20 MG/ML IJ SOLN
INTRAMUSCULAR | Status: DC | PRN
  Administered 2017-01-06: 120 mg via INTRAVENOUS

## 2017-01-06 MED ORDER — MIDAZOLAM HCL 2 MG/2ML IJ SOLN
INTRAMUSCULAR | Status: AC
  Filled 2017-01-06: qty 2

## 2017-01-06 MED ORDER — FENTANYL CITRATE (PF) 100 MCG/2ML IJ SOLN
INTRAMUSCULAR | Status: AC
  Filled 2017-01-06: qty 4

## 2017-01-06 MED ORDER — PROPOFOL 10 MG/ML IV BOLUS
INTRAVENOUS | Status: DC | PRN
  Administered 2017-01-06: 200 mg via INTRAVENOUS

## 2017-01-06 MED ORDER — PHENOL 1.4 % MT LIQD: 1.0000 | OROMUCOSAL | Status: DC | PRN

## 2017-01-06 MED ORDER — OXYMETAZOLINE HCL 0.05 % NA SOLN
NASAL | Status: AC
  Filled 2017-01-06: qty 15

## 2017-01-06 MED ORDER — FENTANYL CITRATE (PF) 100 MCG/2ML IJ SOLN
INTRAMUSCULAR | Status: AC
  Filled 2017-01-06: qty 2

## 2017-01-06 SURGICAL SUPPLY — 65 items
BLADE 10 SAFETY STRL DISP (BLADE) ×3 IMPLANT
BLADE INF TURB ROT M4 2 5PK (BLADE) ×3 IMPLANT
BLADE SURG 15 STRL LF DISP TIS (BLADE) ×2 IMPLANT
BLADE SURG 15 STRL SS (BLADE) ×1
CANISTER SUCT 3000ML PPV (MISCELLANEOUS) ×3 IMPLANT
CLEANER TIP ELECTROSURG 2X2 (MISCELLANEOUS) ×3 IMPLANT
COAGULATOR SUCT 8FR VV (MISCELLANEOUS) ×3 IMPLANT
COVER MAYO STAND STRL (DRAPES) ×3 IMPLANT
DECANTER SPIKE VIAL GLASS SM (MISCELLANEOUS) ×3 IMPLANT
DRAPE INCISE 23X17 IOBAN STRL (DRAPES) ×1
DRAPE INCISE IOBAN 23X17 STRL (DRAPES) ×2 IMPLANT
DRAPE PROXIMA HALF (DRAPES) IMPLANT
DRESSING NASAL KENNEDY 3.5X.9 (MISCELLANEOUS) IMPLANT
DRESSING TELFA 8X10 (GAUZE/BANDAGES/DRESSINGS) ×3 IMPLANT
DRESSING TELFA 8X3 (GAUZE/BANDAGES/DRESSINGS) ×3 IMPLANT
DRSG NASAL KENNEDY 3.5X.9 (MISCELLANEOUS)
DRSG TELFA 3X8 NADH (GAUZE/BANDAGES/DRESSINGS) ×3 IMPLANT
ELECT COATED BLADE 2.86 ST (ELECTRODE) ×3 IMPLANT
ELECT REM PT RETURN 9FT ADLT (ELECTROSURGICAL) ×3
ELECTRODE REM PT RTRN 9FT ADLT (ELECTROSURGICAL) ×2 IMPLANT
GAUZE SPONGE 2X2 8PLY STRL LF (GAUZE/BANDAGES/DRESSINGS) ×2 IMPLANT
GLOVE BIO SURGEON STRL SZ7 (GLOVE) ×3 IMPLANT
GLOVE BIOGEL PI IND STRL 6.5 (GLOVE) ×2 IMPLANT
GLOVE BIOGEL PI IND STRL 7.0 (GLOVE) ×4 IMPLANT
GLOVE BIOGEL PI INDICATOR 6.5 (GLOVE) ×1
GLOVE BIOGEL PI INDICATOR 7.0 (GLOVE) ×2
GLOVE SS BIOGEL STRL SZ 7.5 (GLOVE) ×2 IMPLANT
GLOVE SUPERSENSE BIOGEL SZ 7.5 (GLOVE) ×1
GLOVE SURG SS PI 6.0 STRL IVOR (GLOVE) ×3 IMPLANT
GLOVE SURG SS PI 6.5 STRL IVOR (GLOVE) ×3 IMPLANT
GOWN STRL REUS W/ TWL LRG LVL3 (GOWN DISPOSABLE) ×8 IMPLANT
GOWN STRL REUS W/ TWL XL LVL3 (GOWN DISPOSABLE) ×2 IMPLANT
GOWN STRL REUS W/TWL LRG LVL3 (GOWN DISPOSABLE) ×4
GOWN STRL REUS W/TWL XL LVL3 (GOWN DISPOSABLE) ×1
KIT BASIN OR (CUSTOM PROCEDURE TRAY) ×3 IMPLANT
KIT ROOM TURNOVER OR (KITS) ×3 IMPLANT
NEEDLE HYPO 25GX1X1/2 BEV (NEEDLE) ×3 IMPLANT
NEEDLE HYPO 25X1 1.5 SAFETY (NEEDLE) IMPLANT
NEEDLE PRECISIONGLIDE 27X1.5 (NEEDLE) ×3 IMPLANT
NS IRRIG 1000ML POUR BTL (IV SOLUTION) ×3 IMPLANT
PAD ARMBOARD 7.5X6 YLW CONV (MISCELLANEOUS) ×6 IMPLANT
PATTIES SURGICAL .5 X3 (DISPOSABLE) ×3 IMPLANT
PENCIL FOOT CONTROL (ELECTRODE) ×3 IMPLANT
PREP POVIDONE IODINE SPRAY 2OZ (MISCELLANEOUS) ×3 IMPLANT
SPECIMEN JAR SMALL (MISCELLANEOUS) ×3 IMPLANT
SPLINT NASAL DOYLE BI-VL (GAUZE/BANDAGES/DRESSINGS) IMPLANT
SPONGE GAUZE 2X2 STER 10/PKG (GAUZE/BANDAGES/DRESSINGS) ×1
SPONGE INTESTINAL PEANUT (DISPOSABLE) ×3 IMPLANT
SPONGE TONSIL 1 RF SGL (DISPOSABLE) ×3 IMPLANT
STRIP CLOSURE SKIN 1/2X4 (GAUZE/BANDAGES/DRESSINGS) IMPLANT
SUT CHROMIC 3 0 PS 2 (SUTURE) ×3 IMPLANT
SUT CHROMIC 4 0 PS 2 18 (SUTURE) ×3 IMPLANT
SUT CHROMIC 5 0 P 3 (SUTURE) IMPLANT
SUT ETHILON 3 0 PS 1 (SUTURE) IMPLANT
SUT ETHILON 4 0 PS 2 18 (SUTURE) IMPLANT
SUT ETHILON 5 0 P 3 18 (SUTURE)
SUT NYLON ETHILON 5-0 P-3 1X18 (SUTURE) IMPLANT
SUT SILK 2 0 FS (SUTURE) ×3 IMPLANT
SUT VIC AB 3-0 SH 27 (SUTURE) ×3
SUT VIC AB 3-0 SH 27XBRD (SUTURE) ×6 IMPLANT
SYR 3ML 25GX5/8 SAFETY (SYRINGE) IMPLANT
TOWEL OR 17X24 6PK STRL BLUE (TOWEL DISPOSABLE) ×3 IMPLANT
TRAY ENT MC OR (CUSTOM PROCEDURE TRAY) ×3 IMPLANT
TUBE CONNECTING 12X1/4 (SUCTIONS) ×3 IMPLANT
WATER STERILE IRR 1000ML POUR (IV SOLUTION) ×3 IMPLANT

## 2017-01-06 NOTE — Brief Op Note (Signed)
01/06/2017  9:46 AM  PATIENT:  Spencer SellaMatthew M Bowman  41 y.o. male  PRE-OPERATIVE DIAGNOSIS:  obstructive sleep apnea, nasal obstruction with deviated septum  POST-OPERATIVE DIAGNOSIS:  obstructive sleep apnea, nasal obstruction with deviated septum and turbinate hypertrophy  PROCEDURE:  Procedure(s): NASAL SEPTOPLASTY WITH BILATERAL TURBINATE REDUCTION (Bilateral) UVULOPALATOPHARYNGOPLASTY (UPPP) (N/A)  SURGEON:  Surgeon(s) and Role:    * Drema Halonhristopher E Yovany Clock, MD - Primary  PHYSICIAN ASSISTANT:   ASSISTANTS: none   ANESTHESIA:   general  EBL:  No intake/output data recorded.  BLOOD ADMINISTERED:none  DRAINS: none   LOCAL MEDICATIONS USED:  XYLOCAINE with EPI 10 cc   SPECIMEN:  No Specimen  DISPOSITION OF SPECIMEN:  N/A  COUNTS:  YES  TOURNIQUET:  * No tourniquets in log *  DICTATION: .Other Dictation: Dictation Number [id number]  PLAN OF CARE: Admit for overnight observation  PATIENT DISPOSITION:  PACU - hemodynamically stable.   Delay start of Pharmacological VTE agent (>24hrs) due to surgical blood loss or risk of bleeding: yes

## 2017-01-06 NOTE — Anesthesia Postprocedure Evaluation (Signed)
Anesthesia Post Note  Patient: Spencer SellaMatthew M Ribera  Procedure(s) Performed: Procedure(s) (LRB): NASAL SEPTOPLASTY WITH BILATERAL TURBINATE REDUCTION (Bilateral) UVULOPALATOPHARYNGOPLASTY (UPPP) (N/A)  Patient location during evaluation: PACU Anesthesia Type: General Level of consciousness: awake and alert Pain management: pain level controlled Vital Signs Assessment: post-procedure vital signs reviewed and stable Respiratory status: spontaneous breathing, nonlabored ventilation, respiratory function stable and patient connected to nasal cannula oxygen Cardiovascular status: blood pressure returned to baseline and stable Postop Assessment: no signs of nausea or vomiting Anesthetic complications: no       Last Vitals:  Vitals:   01/06/17 1200 01/06/17 1215  BP: 126/80 134/72  Pulse: 81 87  Resp: (!) 21 13  Temp:  36.5 C    Last Pain:  Vitals:   01/06/17 1130  TempSrc:   PainSc: 0-No pain                 [name] DAVID

## 2017-01-06 NOTE — Progress Notes (Signed)
Patient claims he is recovered from anesthesia, "I'm ready to go to my room". Explained to patient that we are waiting for a room assignment, that as soon as we get one will take him to his room.

## 2017-01-06 NOTE — Anesthesia Preprocedure Evaluation (Addendum)
Anesthesia Evaluation  Patient identified by MRN, date of birth, ID band Patient awake    Airway Mallampati: II  TM Distance: >3 FB Neck ROM: Full    Dental   Pulmonary sleep apnea ,    Pulmonary exam normal        Cardiovascular Normal cardiovascular exam     Neuro/Psych    GI/Hepatic   Endo/Other  Morbid obesity  Renal/GU      Musculoskeletal   Abdominal   Peds  Hematology   Anesthesia Other Findings   Reproductive/Obstetrics                            Anesthesia Physical Anesthesia Plan  ASA: III  Anesthesia Plan: General   Post-op Pain Management:    Induction: Intravenous  Airway Management Planned: Oral ETT and Video Laryngoscope Planned  Additional Equipment:   Intra-op Plan: Utilization Of Total Body Hypothermia per surgeon request  Post-operative Plan: Extubation in OR  Informed Consent: I have reviewed the patients History and Physical, chart, labs and discussed the procedure including the risks, benefits and alternatives for the proposed anesthesia with the patient or authorized representative who has indicated his/her understanding and acceptance.     Plan Discussed with: CRNA and Surgeon  Anesthesia Plan Comments:        Anesthesia Quick Evaluation

## 2017-01-06 NOTE — Transfer of Care (Signed)
Immediate Anesthesia Transfer of Care Note  Patient: Mary SellaMatthew M Mathews  Procedure(s) Performed: Procedure(s): NASAL SEPTOPLASTY WITH BILATERAL TURBINATE REDUCTION (Bilateral) UVULOPALATOPHARYNGOPLASTY (UPPP) (N/A)  Patient Location: PACU  Anesthesia Type:General  Level of Consciousness: awake, alert , oriented and patient cooperative  Airway & Oxygen Therapy: Patient Spontanous Breathing and Patient connected to face mask oxygen  Post-op Assessment: Report given to RN and Post -op Vital signs reviewed and stable  Post vital signs: Reviewed and stable  Last Vitals:  Vitals:   01/06/17 0605 01/06/17 1002  BP: 137/78   Pulse: 64 96  Resp: 18 12  Temp: 36.8 C 36.4 C    Last Pain:  Vitals:   01/06/17 1002  TempSrc:   PainSc: 0-No pain      Patients Stated Pain Goal: 9 (01/06/17 0551)  Complications: No apparent anesthesia complications

## 2017-01-06 NOTE — Anesthesia Procedure Notes (Addendum)
Procedure Name: Intubation Date/Time: 01/06/2017 7:42 AM Performed by: Faustino CongressWHITE, Coben Godshall TENA Sebastain Fishbaugh Pre-anesthesia Checklist: Patient identified, Emergency Drugs available, Suction available and Patient being monitored Patient Re-evaluated:Patient Re-evaluated prior to inductionOxygen Delivery Method: Circle System Utilized Preoxygenation: Pre-oxygenation with 100% oxygen Intubation Type: IV induction Ventilation: Mask ventilation without difficulty Laryngoscope Size: Glidescope and 4 (elective glidescope) Grade View: Grade I Tube type: Oral Tube size: 7.5 mm Number of attempts: 1 Airway Equipment and Method: Stylet Placement Confirmation: ETT inserted through vocal cords under direct vision,  positive ETCO2 and breath sounds checked- equal and bilateral Secured at: 22 cm Tube secured with: Tape Dental Injury: Teeth and Oropharynx as per pre-operative assessment

## 2017-01-06 NOTE — Progress Notes (Signed)
Post Op check Awake alert doing well No respiratory problems Minimal bleeding Plan to remove nasal packing and discharge in am.

## 2017-01-06 NOTE — Interval H&P Note (Signed)
History and Physical Interval Note:  01/06/2017 7:30 AM  Spencer SellaMatthew M Deman  has presented today for surgery, with the diagnosis of obstructive sleep apnea  The various methods of treatment have been discussed with the patient and family. After consideration of risks, benefits and other options for treatment, the patient has consented to  Procedure(s): NASAL SEPTOPLASTY WITH BILATERAL TURBINATE REDUCTION (Bilateral) UVULOPALATOPHARYNGOPLASTY (UPPP) (N/A) as a surgical intervention .  The patient's history has been reviewed, patient examined, no change in status, stable for surgery.  I have reviewed the patient's chart and labs.  Questions were answered to the patient's satisfaction.     [name]

## 2017-01-07 ENCOUNTER — Encounter (HOSPITAL_COMMUNITY): Payer: Self-pay | Admitting: Otolaryngology

## 2017-01-07 DIAGNOSIS — G4733 Obstructive sleep apnea (adult) (pediatric): Secondary | ICD-10-CM | POA: Diagnosis not present

## 2017-01-07 MED ORDER — HYDROCODONE-ACETAMINOPHEN 7.5-325 MG/15ML PO SOLN: 10.0000 mL | Freq: Four times a day (QID) | ORAL | 0 refills | Status: AC | PRN

## 2017-01-07 MED ORDER — AMOXICILLIN 250 MG/5ML PO SUSR: 500.0000 mg | Freq: Two times a day (BID) | ORAL | 0 refills | Status: DC

## 2017-01-07 NOTE — Progress Notes (Signed)
Received order to d/c patient. PIV removed. Reviewed discharge packet with pt. Answered all questions and concerns.

## 2017-01-07 NOTE — Progress Notes (Signed)
POD 1 AF VSS O2 > 90 Nasal packs removed with minimal bleeding No tonsil bleeding noted Discharge Home Amoxicillin and Hydrocodone prn pain Follow up 9 days Doing well post op surgery for OSA Discharge dictated

## 2017-01-07 NOTE — Discharge Summary (Signed)
Dictated  # [id number]

## 2017-01-07 NOTE — Discharge Instructions (Signed)
Instructions for Home Care After Tonsillectomy  First Day Home: Encourage fluid intake by frequently offering liquids, soup, ice cream jello, etc.  Drink several glasses of water.  Cooler fluids are best.  Avoid hot and highly seasoned foods.  Orange juice, grapefruit juice and tomato juice may cause stinging sensation because of their acidic content.    Second and Third Day Home: Continue liquids and add soft foods, (pudding, macaroni and cheese, mashed potatoes, soft scrambled eggs, etc.).  Make sure you drink plenty of liquids so you do not get dehydrated.  Fifth Thru Seventh Day Home: Gradually resume a normal diet, but avoid hot foods, potato chips, nuts, toast and crackers until 2 weeks after surgery.  General Instructions   No undue physical exertion or exercise for one week.  Children: Tylenol may be used for discomfort and/or fever.  Use as often as necessary within limits of the directions.  Adults: May spray throat with Chloroseptic or other topical anesthetic for discomfort and use pain medication obtained by prescription as directed.    A slight fever (up to 101) is expected for the first the first couple of days.  Take Tylenol (or aspirin substitute) as directed.  Pain in ears is common after tonsillectomy.  It represents pain referred from the throat where the tonsils were removed.  There is usually nothing wrong with the ears in most cases.  Administer Tylenol as needed to control this pain.  White patches will form where the tonsils were removed.  This is perfectly normal.  They will disappear in one to two weeks.  Mouth odor may be notice during the healing stages.   In a very small percentage of people, there is some bleeding after five to six days.  If this happens, do not become excited, for the bleeding is usually light.  Be quiet, lie down, and spit the blood out gently.  Gargle the throat with ice water.  If the bleeding does not stop promptly, call the office  [phone]), which answers 24 hours a day.  A follow up appointment should be made with Dr. Ezzard StandingNewman 10-14 days following surgery. Please call [phone] for the appointment time.  Amoxicillin 500 mg twice per day for the next week Tylenol, motrin or Hydrocodone elixir 10-15 cc ( 2-3 tsp) every 6 hrs prn pain  Call office if any problems or questions. Call and make appt for Monday 3/12.    [phone]

## 2017-01-09 NOTE — Op Note (Signed)
Spencer Bowman, Spencer Bowman               ACCOUNT NO.:  [id number]  MEDICAL RECORD NO.:  [id number]  LOCATION:                                 FACILITY:  PHYSICIAN:  Spencer Bowman, Spencer BowmanDATE OF BIRTH:  06-03-1976  DATE OF PROCEDURE:  01/06/2017 DATE OF DISCHARGE:                              OPERATIVE REPORT   PREOPERATIVE DIAGNOSES:  Obstructive sleep apnea with nasal obstruction, deviated septum, and turbinate hypertrophy.  POSTOPERATIVE DIAGNOSIS:  Obstructive sleep apnea with nasal obstruction, deviated septum, and turbinate hypertrophy.  OPERATION PERFORMED:  Septoplasty with bilateral inferior turbinate reductions, uvulopalatopharyngoplasty.  SURGEON:  Spencer Bowman, M.D.  ANESTHESIA:  General endotracheal.  COMPLICATIONS:  None.  ESTIMATED BLOOD LOSS:  Minimal.  BRIEF CLINICAL NOTE:  The patient is a 41 year old gentleman who chronically has snoring, but more recently, wife noted that he stops breathing at night.  He underwent a sleep test which showed severe obstructive sleep apnea with RDI of 48 and O2 sats down into the 70s. He has also put on weight in the last year.  He is significantly overweight and trying to lose weight.  Discussed extensively with him concerning losing weight, use of nasal CPAP, but he really refuses to use nasal CPAP machine and wants the trial of surgical intervention.  On exam, he does have deviated septum to the left with moderate turbinate hypertrophy and nasal obstruction.  He also still has tonsils with a very long uvula and soft palate.  He was taken to the operating room at this time for septoplasty, turbinate reductions to help improve with nasal breathing and tonsillectomy with uvulopalatopharyngoplasty to help open up the oropharynx.  Of note, he is a slight retrognathic with a fairly large tongue base.  He was taken to the operating room at this time for surgery despite recommendations for use of nasal  CPAP.  DESCRIPTION OF PROCEDURE:  After adequate endotracheal anesthesia, the patient received 10 mg of Decadron and 3 g of Ancef IV preoperatively. First, the nose was prepped with Betadine solution, draped out with sterile towels.  Nose was then further prepped with cotton pledgets soaked in Afrin and septum turbinates were injected with Xylocaine with epinephrine for hemostasis.  The patient had the bowing off the septum to the left side with a septal spur on the left side and the portion of the cartilaginous septum bowed off into the left maxillary airway off the maxillary crest.  A hemitransfixion incision was made along the left side of the septum.  Mucoperichondrial and mucoperiosteal flaps were elevated posteriorly.  Portion of cartilaginous septum that bowed off into the left airway was removed and a portion of the bony maxillary crest that protruded into the left side was also removed with osteotomes.  More posteriorly, just anterior to the vomer bone, a vertical incision was made through the cartilage and mucoperichondrial and mucoperiosteal flaps were elevated on either side.  At this point, the bony septum that bowed into the left in addition to a bony septal spur posteriorly was removed.  This completed the septoplasty portion of the procedure and significantly improved the left airway.  Next, using the [employer] turbinate blade, submucosal turbinate reductions were  performed bilaterally.  The turbinates were then outfractured.  In addition, on the left side, an additional inferior 1/3rd of the more posterior turbinate was amputated.  Suction cautery was used for hemostasis.  Both turbinates were outfractured.  Hemitransfixion incision was closed with interrupted 4-0 chromic sutures.  The septum was fixed with 4-0 chromic sutures.  The nose was packed with Telfa soaked in Bactroban, placed along the floor of the nose bilaterally. This completed the septoplasty portion of  the procedure.  Next, the patient was turned and mouthgag was used to expose the oropharynx.  The patient had a large amount of redundant mucosa around the tonsils as well as average-sized tonsils and a very elongated uvula.  First, the tonsils were removed from the tonsillar fossa using the cautery.  Some additional anterior and posterior tonsillar pillar of the tonsils were removed in addition to the tonsil.  Then, the uvula was transected basically at its base of attachment to the soft palate and approximately 8 to 10 mm vertical incision was made along the base of the uvula on either side into the soft palate and a little bit of additional soft palate mucosa was removed about 8 mm.  This completed the uvulopalatopharyngoplasty.  Mucosal edges of the soft palate were reapproximated with 4-0 Vicryl and 4-0 chromic sutures.  Hemostasis was obtained with cautery.  Oropharynx and nasopharynx were irrigated with saline.  There was no substantial bleeding.  The patient was awoken from anesthesia and transferred to recovery room, postop doing well.  DISPOSITION:  The patient will be observed overnight in the step-down unit and plan on removing his nasal packing and discharge him home in the morning on amoxicillin 500 mg b.i.d. for 10 days and Tylenol and hydrocodone elixir 2 to 3 teaspoons q.4 hours p.r.n. pain.  I will have him follow up in my office in 10 to 12 days for recheck.    ______________________________ Spencer Bowman, M.D.   ______________________________ Spencer Bowman, M.D.    CEN/MEDQ  D:  01/06/2017  T:  01/07/2017  Job:  [id number]  cc:   Spencer MckusickJohn C. Golding, M.D.

## 2017-01-09 NOTE — Discharge Summary (Signed)
NAMAlexia Bowman:  Minihan, Mathis               ACCOUNT NO.:  [id number]  MEDICAL RECORD NO.:  [id number]  LOCATION:                                 FACILITY:  PHYSICIAN:  Kristine GarbeChristopher E. Ezzard StandingNewman, M.D.DATE OF BIRTH:  Jun 16, 1976  DATE OF ADMISSION:  01/06/2017 DATE OF DISCHARGE:  01/07/2017                              DISCHARGE SUMMARY   DIAGNOSES: 1. Severe obstructive sleep apnea. 2. Nasal obstruction with septal deviation and turbinate hypertrophy.  OPERATIONS DURING THIS HOSPITALIZATION:  Septoplasty with bilateral inferior turbinate reductions and uvulopalatopharyngoplasty with tonsillectomy on 01/06/2017.  HOSPITAL COURSE:  The patient was admitted via the operating room on 01/06/2017 at which time, he underwent uvulopalatopharyngoplasty, septoplasty, and turbinate reductions for treatment of severe obstructive sleep apnea.  Postoperatively, he was monitored in the step- down unit.  He received preoperative antibiotic Ancef.  He remained afebrile.  Had no significant oxygen desaturations during hospitalization.  His nasal packs were removed on his first postoperative day.  He had no significant bleeding.  He was able to take p.o. adequately.  He was subsequently discharged home on his first postoperative day.  Doing well.  DISPOSITION:  The patient was discharged home.  He will follow up in my office in 9 days for recheck.  Discharge medications include his home medications along with amoxicillin suspension 500 mg b.i.d. for 1 week, Tylenol, Motrin, hydrocodone, elixir 10-15 mL q.6 hours p.r.n. pain.    ______________________________ Kristine Garbehristopher E. Ezzard StandingNewman, M.D.   ______________________________ Kristine Garbehristopher E. Ezzard StandingNewman, M.D.    CEN/MEDQ  D:  01/07/2017  T:  01/08/2017  Job:  [id number]

## 2019-09-02 ENCOUNTER — Encounter (INDEPENDENT_AMBULATORY_CARE_PROVIDER_SITE_OTHER): Payer: Self-pay

## 2021-11-04 ENCOUNTER — Other Ambulatory Visit: Payer: Self-pay

## 2021-11-04 ENCOUNTER — Encounter: Payer: Self-pay | Admitting: Emergency Medicine

## 2021-11-04 ENCOUNTER — Ambulatory Visit
Admission: EM | Admit: 2021-11-04 | Discharge: 2021-11-04 | Disposition: A | Discharge status: Home / Self Care | Payer: BLUE CROSS/BLUE SHIELD | Attending: Emergency Medicine | Admitting: Emergency Medicine

## 2021-11-04 DIAGNOSIS — L600 Ingrowing nail: Secondary | ICD-10-CM

## 2021-11-04 MED ORDER — HIBICLENS 4 % EX LIQD: Freq: Every day | CUTANEOUS | 0 refills | Status: DC | PRN

## 2021-11-04 MED ORDER — DOXYCYCLINE HYCLATE 100 MG PO CAPS: 100.0000 mg | ORAL_CAPSULE | Freq: Two times a day (BID) | ORAL | 0 refills | Status: AC

## 2021-11-04 MED ORDER — IBUPROFEN 600 MG PO TABS: 600.0000 mg | ORAL_TABLET | Freq: Four times a day (QID) | ORAL | 0 refills | Status: DC | PRN

## 2021-11-04 NOTE — ED Triage Notes (Signed)
Ingrown toe nail on left great toe.  Since before Christmas

## 2021-11-04 NOTE — ED Provider Notes (Signed)
HPI  SUBJECTIVE:  Spencer Bowman is a 45 y.o. male who presents with left first toe pain, swelling, erythema, purulent drainage along the medial nail fold after tearing part of the toenail off last week.  He states that his wife attempted to "pull it out" last night which made symptoms worse.  He has also been soaking his toe in salt water and applying Neosporin.  Symptoms are worse with walking and with palpation.  No fevers, toe swelling, erythema streaking up his toe or foot.  Past medical history negative for MRSA, diabetes.  PMD: None.    Past Medical History:  Diagnosis Date   Sleep apnea    no cpap    Past Surgical History:  Procedure Laterality Date   DENTAL SURGERY     wisdom teeth   NASAL SEPTOPLASTY W/ TURBINOPLASTY Bilateral 01/06/2017   Procedure: NASAL SEPTOPLASTY WITH BILATERAL TURBINATE REDUCTION;  Surgeon: Drema Halon, MD;  Location: [facility] OR;  Service: ENT;  Laterality: Bilateral;   UVULOPALATOPHARYNGOPLASTY N/A 01/06/2017   Procedure: UVULOPALATOPHARYNGOPLASTY (UPPP);  Surgeon: Drema Halon, MD;  Location: [facility] OR;  Service: ENT;  Laterality: N/A;    History reviewed. No pertinent family history.  Social History   Tobacco Use   Smoking status: Never   Smokeless tobacco: Never  Substance Use Topics   Alcohol use: No   Drug use: No    No current facility-administered medications for this encounter.  Current Outpatient Medications:    chlorhexidine (HIBICLENS) 4 % external liquid, Apply topically daily as needed. Dilute 10-15 mL in water, Use daily when bathing for 1-2 weeks, Disp: 120 mL, Rfl: 0   doxycycline (VIBRAMYCIN) 100 MG capsule, Take 1 capsule (100 mg total) by mouth 2 (two) times daily for 5 days., Disp: 10 capsule, Rfl: 0   ibuprofen (ADVIL) 600 MG tablet, Take 1 tablet (600 mg total) by mouth every 6 (six) hours as needed., Disp: 30 tablet, Rfl: 0  Allergies  Allergen Reactions   No Known Allergies      ROS  As noted in HPI.    Physical Exam  BP (!) 153/97 (BP Location: Right Arm)    Pulse 82    Temp 97.9 F (36.6 C) (Oral)    Resp 18    SpO2 97%   Constitutional: Well developed, well nourished, no acute distress Eyes:  EOMI, conjunctiva normal bilaterally HENT: Normocephalic, atraumatic,mucus membranes moist Respiratory: Normal inspiratory effort Cardiovascular: Normal rate GI: nondistended skin: No rash, skin intact Musculoskeletal: Medial nail fold left first toe erythematous, swollen, tender to touch with expressible purulent drainage.  No evidence of felon.   Neurologic: Alert & oriented x 3, no focal neuro deficits Psychiatric: Speech and behavior appropriate   ED Course   Medications - No data to display  Orders Placed This Encounter  Procedures   Apply dressing    Standing Status:   Standing    Number of Occurrences:   1    No results found for this or any previous visit (from the past 24 hour(s)). No results found.  ED Clinical Impression  1. Ingrown toenail of left foot with infection      ED Assessment/Plan  Patient insistent that I attempt to remove the nail.  Offered antibiotics, pain medication, conservative treatment, and referral to podiatry, but patient wants the toenail "cut out" today..  Discussed with him the risks of performing the procedure, including worsening symptoms, infection and recurrence.  Patient still wishes to proceed.  Procedure note:  Soaked toe and diluted chlorhexidine and tap water.  Then cleaned toe with alcohol and performed digital block with 2 cc of 2% plain lidocaine with adequate anesthesia.  cleaned again with chlorhexidine soap and water, alcohol, and using a sterile pair of straight scissors,  Kelly clamps, removed part of the nail with successful removal of the ingrown toenail.  Bacitracin, dressing placed afterwards.  Patient tolerated procedure well.  Home with doxycycline, chlorhexidine soap, he is to soak it twice a day.  Keep this clean and  dry for 72 hours, follow-up with podiatry if it recurs.   Discussed  MDM, treatment plan, and plan for follow-up with patient.patient agrees with plan.   Meds ordered this encounter  Medications   doxycycline (VIBRAMYCIN) 100 MG capsule    Sig: Take 1 capsule (100 mg total) by mouth 2 (two) times daily for 5 days.    Dispense:  10 capsule    Refill:  0   chlorhexidine (HIBICLENS) 4 % external liquid    Sig: Apply topically daily as needed. Dilute 10-15 mL in water, Use daily when bathing for 1-2 weeks    Dispense:  120 mL    Refill:  0   ibuprofen (ADVIL) 600 MG tablet    Sig: Take 1 tablet (600 mg total) by mouth every 6 (six) hours as needed.    Dispense:  30 tablet    Refill:  0      *This clinic note was created using Scientist, clinical (histocompatibility and immunogenetics). Therefore, there may be occasional mistakes despite careful proofreading.  ?    Domenick Gong, MD 11/05/21 1228

## 2021-11-04 NOTE — Discharge Instructions (Addendum)
Finish the doxycycline, even if you feel better.  May take 1000 mg of Tylenol with 600 mg of ibuprofen 3-4 times a day as needed for pain.  Sepsis and chlorhexidine soapy water twice a day.  The dressing clean and dry for 72 hours.  Make sure you wear wide toed shoes.  Wear a clean sock until this heals.  Follow-up with podiatry if this recurs.

## 2024-02-13 ENCOUNTER — Ambulatory Visit: Payer: Self-pay | Admitting: Internal Medicine

## 2024-02-13 ENCOUNTER — Encounter: Payer: Self-pay | Admitting: Internal Medicine

## 2024-02-13 VITALS — BP 165/104 | HR 81 | Ht 69.0 in | Wt 323.0 lb

## 2024-02-13 DIAGNOSIS — G4733 Obstructive sleep apnea (adult) (pediatric): Secondary | ICD-10-CM

## 2024-02-13 DIAGNOSIS — R030 Elevated blood-pressure reading, without diagnosis of hypertension: Secondary | ICD-10-CM | POA: Insufficient documentation

## 2024-02-13 DIAGNOSIS — R5383 Other fatigue: Secondary | ICD-10-CM | POA: Diagnosis not present

## 2024-02-13 DIAGNOSIS — Z114 Encounter for screening for human immunodeficiency virus [HIV]: Secondary | ICD-10-CM

## 2024-02-13 DIAGNOSIS — Z131 Encounter for screening for diabetes mellitus: Secondary | ICD-10-CM | POA: Diagnosis not present

## 2024-02-13 DIAGNOSIS — Z13220 Encounter for screening for lipoid disorders: Secondary | ICD-10-CM

## 2024-02-13 DIAGNOSIS — Z1159 Encounter for screening for other viral diseases: Secondary | ICD-10-CM

## 2024-02-13 DIAGNOSIS — Z1321 Encounter for screening for nutritional disorder: Secondary | ICD-10-CM

## 2024-02-13 DIAGNOSIS — Z1329 Encounter for screening for other suspected endocrine disorder: Secondary | ICD-10-CM

## 2024-02-13 DIAGNOSIS — Z1211 Encounter for screening for malignant neoplasm of colon: Secondary | ICD-10-CM | POA: Insufficient documentation

## 2024-02-13 NOTE — Assessment & Plan Note (Signed)
 Current weight 323 pounds.  BMI 47.7.  Basic labs ordered today.  Lifestyle modifications and weight loss were reviewed today.

## 2024-02-13 NOTE — Assessment & Plan Note (Signed)
 BP elevated in office today.  No prior history of hypertension.  Follow-up in 4 weeks for BP check.

## 2024-02-13 NOTE — Assessment & Plan Note (Signed)
 His chief concern today is fatigue, which he potentially attributes to low testosterone levels.  We reviewed that there are multiple etiologies for fatigue.  Will start with checking basic labs.  We also discussed that this may be due to untreated OSA.  Follow-up in 4 weeks for reassessment.

## 2024-02-13 NOTE — Assessment & Plan Note (Signed)
 Cologuard ordered today

## 2024-02-13 NOTE — Progress Notes (Signed)
 New Patient Office Visit  Subjective    Patient ID: Spencer Bowman, male    DOB: 1976-08-27  Age: 48 y.o. MRN: [id number]  CC:  Chief Complaint  Patient presents with   Establish Care   testosterone    Patient feels he has low testosterone    Obesity    Gaining weight averaging 20lbs a year     HPI Spencer Bowman presents to establish care.  He is a 48 year old male who endorses a past medical history significant for OSA.  He underwent nasal septoplasty in March 2018.  He reports feeling well today.  His acute concerns are fatigue and weight gain.  He is concerned that his testosterone level was low and would like to have it checked.  He endorses weight gain in recent years, averaging 20 pounds of weight gain annually.  He is also concerned for erectile dysfunction.  Spencer Bowman currently works in [employer].  He endorses former tobacco use, quitting over 10 years ago.  Denies alcohol or illicit drug use.  He is unaware of any significant family medical history.  Acute concerns, chronic medical conditions, and outstanding preventative care items discussed today are individually addressed in A/P below.   Outpatient Encounter Medications as of 02/13/2024  Medication Sig   [DISCONTINUED] chlorhexidine (HIBICLENS) 4 % external liquid Apply topically daily as needed. Dilute 10-15 mL in water, Use daily when bathing for 1-2 weeks (Patient not taking: Reported on 02/13/2024)   [DISCONTINUED] ibuprofen (ADVIL) 600 MG tablet Take 1 tablet (600 mg total) by mouth every 6 (six) hours as needed. (Patient not taking: Reported on 02/13/2024)   No facility-administered encounter medications on file as of 02/13/2024.    Past Medical History:  Diagnosis Date   Sleep apnea    no cpap    Past Surgical History:  Procedure Laterality Date   DENTAL SURGERY     wisdom teeth   NASAL SEPTOPLASTY W/ TURBINOPLASTY Bilateral 01/06/2017   Procedure: NASAL SEPTOPLASTY WITH BILATERAL TURBINATE REDUCTION;  Surgeon:  Spencer Halon, MD;  Location: [facility] OR;  Service: ENT;  Laterality: Bilateral;   UVULOPALATOPHARYNGOPLASTY N/A 01/06/2017   Procedure: UVULOPALATOPHARYNGOPLASTY (UPPP);  Surgeon: Spencer Halon, MD;  Location: [facility] OR;  Service: ENT;  Laterality: N/A;    History reviewed. No pertinent family history.  Social History   Socioeconomic History   Marital status: Married    Spouse name: Not on file   Number of children: Not on file   Years of education: Not on file   Highest education level: Not on file  Occupational History   Not on file  Tobacco Use   Smoking status: Never   Smokeless tobacco: Never  Substance and Sexual Activity   Alcohol use: No   Drug use: No   Sexual activity: Not on file  Other Topics Concern   Not on file  Social History Narrative   Not on file   Social Drivers of Health   Financial Resource Strain: Not on file  Food Insecurity: Not on file  Transportation Needs: Not on file  Physical Activity: Not on file  Stress: Not on file  Social Connections: Not on file  Intimate Partner Violence: Not on file   Review of Systems  Constitutional:  Positive for malaise/fatigue. Negative for chills and fever.  HENT:  Negative for sore throat.   Respiratory:  Negative for cough and shortness of breath.   Cardiovascular:  Negative for chest pain, palpitations and leg swelling.  Gastrointestinal:  Negative for abdominal pain, blood in stool, constipation, diarrhea, nausea and vomiting.  Genitourinary:  Negative for dysuria and hematuria.  Musculoskeletal:  Negative for myalgias.  Skin:  Negative for itching and rash.  Neurological:  Negative for dizziness and headaches.  Psychiatric/Behavioral:  Negative for depression and suicidal ideas.    Objective    BP (!) 165/104 (BP Location: Right Arm, Patient Position: Sitting, Cuff Size: Large)   Pulse 81   Ht 5\' 9"  (1.753 m)   Wt (!) 323 lb (146.5 kg)   SpO2 93%   BMI 47.70 kg/m   Physical Exam Vitals  reviewed.  Constitutional:      General: He is not in acute distress.    Appearance: Normal appearance. He is obese. He is not ill-appearing.  HENT:     Head: Normocephalic and atraumatic.     Right Ear: External ear normal.     Left Ear: External ear normal.     Nose: Nose normal. No congestion or rhinorrhea.     Mouth/Throat:     Mouth: Mucous membranes are moist.     Pharynx: Oropharynx is clear.  Eyes:     General: No scleral icterus.    Extraocular Movements: Extraocular movements intact.     Conjunctiva/sclera: Conjunctivae normal.     Pupils: Pupils are equal, round, and reactive to light.  Cardiovascular:     Rate and Rhythm: Normal rate and regular rhythm.     Pulses: Normal pulses.     Heart sounds: Normal heart sounds. No murmur heard. Pulmonary:     Effort: Pulmonary effort is normal.     Breath sounds: Normal breath sounds. No wheezing, rhonchi or rales.  Abdominal:     General: Abdomen is flat. Bowel sounds are normal. There is no distension.     Palpations: Abdomen is soft.     Tenderness: There is no abdominal tenderness.  Musculoskeletal:        General: No swelling or deformity. Normal range of motion.     Cervical back: Normal range of motion.  Skin:    General: Skin is warm and dry.     Capillary Refill: Capillary refill takes less than 2 seconds.  Neurological:     General: No focal deficit present.     Mental Status: He is alert and oriented to person, place, and time.     Motor: No weakness.  Psychiatric:        Mood and Affect: Mood normal.        Behavior: Behavior normal.        Thought Content: Thought content normal.    Assessment & Plan:   Problem List Items Addressed This Visit       Obstructive sleep apnea of adult - Primary   Documented history of OSA s/p nasal septoplasty in March 2018.  Not using CPAP currently.  His chief concern today is fatigue.  I suspect this may be due to untreated OSA, but will check basic labs first.  We will  tentatively plan for follow-up in 4 weeks for reassessment.      Morbid obesity (HCC)   Current weight 323 pounds.  BMI 47.7.  Basic labs ordered today.  Lifestyle modifications and weight loss were reviewed today.      Fatigue   His chief concern today is fatigue, which he potentially attributes to low testosterone levels.  We reviewed that there are multiple etiologies for fatigue.  Will start with checking basic labs.  We also discussed that this may be due to untreated OSA.  Follow-up in 4 weeks for reassessment.      Elevated blood pressure reading   BP elevated in office today.  No prior history of hypertension.  Follow-up in 4 weeks for BP check.      Colon cancer screening   Cologuard ordered today      Return in about 4 weeks (around 03/12/2024) for BP check, lab review.   Billie Lade, MD

## 2024-02-13 NOTE — Patient Instructions (Signed)
 It was a pleasure to see you today.  Thank you for giving [place] the opportunity to be involved in your care.  Below is a brief recap of your visit and next steps.  We will plan to see you again in 4 weeks.   Summary You have established care today No medication changes were made Basic labs ordered Cologuard ordered Follow up in 4 weeks for BP check and lab review

## 2024-02-13 NOTE — Assessment & Plan Note (Signed)
 Documented history of OSA s/p nasal septoplasty in March 2018.  Not using CPAP currently.  His chief concern today is fatigue.  I suspect this may be due to untreated OSA, but will check basic labs first.  We will tentatively plan for follow-up in 4 weeks for reassessment.

## 2024-02-16 ENCOUNTER — Ambulatory Visit: Payer: Self-pay

## 2024-02-16 NOTE — Telephone Encounter (Signed)
 Copied from CRM [id number]. Topic: Clinical - Lab/Test Results >> Feb 16, 2024 11:10 AM Spencer Bowman wrote: Reason for CRM: Patient has further questions about labs.   Patient called for clarification on lab results and states he was not told what dietary changes should be made. I advised the patient of changes he could make before his follow-up appointment, and advised him that these things will likely be discussed during that visit. Patient verbalized understanding and agreement with this plan.    Reason for Disposition  [1] Other NON-URGENT information for PCP AND [2] does not require PCP response  Answer Assessment - Initial Assessment Questions 1. REASON FOR CALL or QUESTION: "What is your reason for calling today?" or "How can I best help you?" or "What question do you have that I can help answer?"     Patient had further questions about his labs  2. CALLER: Document the source of call. (e.g., laboratory, patient).     Patient  Protocols used: PCP Call - No Triage-A-AH

## 2024-02-17 LAB — CMP14+EGFR
ALT: 59 IU/L — ABNORMAL HIGH (ref 0–44)
AST: 28 IU/L (ref 0–40)
Albumin: 4.5 g/dL (ref 4.1–5.1)
Alkaline Phosphatase: 67 IU/L (ref 44–121)
BUN/Creatinine Ratio: 15 (ref 9–20)
BUN: 13 mg/dL (ref 6–24)
Bilirubin Total: 0.4 mg/dL (ref 0.0–1.2)
CO2: 22 mmol/L (ref 20–29)
Calcium: 9.2 mg/dL (ref 8.7–10.2)
Chloride: 104 mmol/L (ref 96–106)
Creatinine, Ser: 0.87 mg/dL (ref 0.76–1.27)
Globulin, Total: 2.2 g/dL (ref 1.5–4.5)
Glucose: 117 mg/dL — ABNORMAL HIGH (ref 70–99)
Potassium: 4.7 mmol/L (ref 3.5–5.2)
Sodium: 141 mmol/L (ref 134–144)
Total Protein: 6.7 g/dL (ref 6.0–8.5)
eGFR: 108 mL/min/{1.73_m2} (ref >59)

## 2024-02-17 LAB — TSH+FREE T4
Free T4: 0.99 ng/dL (ref 0.82–1.77)
TSH: 3.43 u[IU]/mL (ref 0.450–4.500)

## 2024-02-17 LAB — HCV AB W REFLEX TO QUANT PCR: HCV Ab: NONREACTIVE

## 2024-02-17 LAB — CBC WITH DIFFERENTIAL/PLATELET
Basophils Absolute: 0.1 10*3/uL (ref 0.0–0.2)
Basos: 1 %
EOS (ABSOLUTE): 0.2 10*3/uL (ref 0.0–0.4)
Eos: 3 %
Hematocrit: 47.9 % (ref 37.5–51.0)
Hemoglobin: 16.9 g/dL (ref 13.0–17.7)
Immature Grans (Abs): 0 10*3/uL (ref 0.0–0.1)
Immature Granulocytes: 0 %
Lymphocytes Absolute: 3.1 10*3/uL (ref 0.7–3.1)
Lymphs: 39 %
MCH: 32.6 pg (ref 26.6–33.0)
MCHC: 35.3 g/dL (ref 31.5–35.7)
MCV: 92 fL (ref 79–97)
Monocytes Absolute: 0.7 10*3/uL (ref 0.1–0.9)
Monocytes: 9 %
Neutrophils Absolute: 3.9 10*3/uL (ref 1.4–7.0)
Neutrophils: 48 %
Platelets: 312 10*3/uL (ref 150–450)
RBC: 5.19 x10E6/uL (ref 4.14–5.80)
RDW: 12.3 % (ref 11.6–15.4)
WBC: 8.1 10*3/uL (ref 3.4–10.8)

## 2024-02-17 LAB — LIPID PANEL
Chol/HDL Ratio: 5.8 ratio — ABNORMAL HIGH (ref 0.0–5.0)
Cholesterol, Total: 230 mg/dL — ABNORMAL HIGH (ref 100–199)
HDL: 40 mg/dL (ref >39)
LDL Chol Calc (NIH): 164 mg/dL — ABNORMAL HIGH (ref 0–99)
Triglycerides: 144 mg/dL (ref 0–149)
VLDL Cholesterol Cal: 26 mg/dL (ref 5–40)

## 2024-02-17 LAB — TESTOSTERONE,FREE AND TOTAL
Testosterone, Free: 8.6 pg/mL (ref 6.8–21.5)
Testosterone: 336 ng/dL (ref 264–916)

## 2024-02-17 LAB — VITAMIN D 25 HYDROXY (VIT D DEFICIENCY, FRACTURES): Vit D, 25-Hydroxy: 32.2 ng/mL (ref 30.0–100.0)

## 2024-02-17 LAB — B12 AND FOLATE PANEL
Folate: 13.1 ng/mL (ref >3.0)
Vitamin B-12: 541 pg/mL (ref 232–1245)

## 2024-02-17 LAB — HIV ANTIBODY (ROUTINE TESTING W REFLEX): HIV Screen 4th Generation wRfx: NONREACTIVE

## 2024-02-17 LAB — HEMOGLOBIN A1C
Est. average glucose Bld gHb Est-mCnc: 131 mg/dL
Hgb A1c MFr Bld: 6.2 % — ABNORMAL HIGH (ref 4.8–5.6)

## 2024-02-17 LAB — HCV INTERPRETATION

## 2024-02-21 LAB — COLOGUARD: Cologuard: NEGATIVE

## 2024-02-28 LAB — COLOGUARD: COLOGUARD: NEGATIVE

## 2024-03-15 ENCOUNTER — Encounter: Payer: Self-pay | Admitting: Internal Medicine

## 2024-03-15 ENCOUNTER — Other Ambulatory Visit: Payer: Self-pay

## 2024-03-15 ENCOUNTER — Ambulatory Visit: Admitting: Internal Medicine

## 2024-03-15 DIAGNOSIS — E782 Mixed hyperlipidemia: Secondary | ICD-10-CM | POA: Diagnosis not present

## 2024-03-15 DIAGNOSIS — R7303 Prediabetes: Secondary | ICD-10-CM

## 2024-03-15 DIAGNOSIS — G4733 Obstructive sleep apnea (adult) (pediatric): Secondary | ICD-10-CM

## 2024-03-15 MED ORDER — ZEPBOUND 2.5 MG/0.5ML ~~LOC~~ SOAJ: 2.5000 mg | SUBCUTANEOUS | 0 refills | Status: AC

## 2024-03-15 MED ORDER — ZEPBOUND 2.5 MG/0.5ML ~~LOC~~ SOAJ: 2.5000 mg | SUBCUTANEOUS | 0 refills | Status: DC

## 2024-03-15 NOTE — Progress Notes (Signed)
 Established Patient Office Visit  Subjective   Patient ID: Spencer Bowman, male    DOB: 1976/10/24  Age: 48 y.o. MRN: [id number]  Chief Complaint  Patient presents with   Hypertension    Follow up    Spencer Bowman returns to care today for BP check and lab review.  He was last evaluated by me on 4/8 as a new patient presenting to establish care.  At that time his chief concern was fatigue, which he attributed to low testosterone  levels.  His blood pressure was also elevated at that time.  No medication changes were made, basic labs ordered, and 4-week follow-up recommended for reassessment.  There have been no acute interval events.  Today he reports feeling well.  He has made significant dietary changes in an effort to lose weight and improve his overall health.  He has also started taking multiple over-the-counter supplements.  He does not have any acute concerns to discuss today.  His blood pressure today is normotensive.   Past Medical History:  Diagnosis Date   Sleep apnea    no cpap   Past Surgical History:  Procedure Laterality Date   DENTAL SURGERY     wisdom teeth   NASAL SEPTOPLASTY W/ TURBINOPLASTY Bilateral 01/06/2017   Procedure: NASAL SEPTOPLASTY WITH BILATERAL TURBINATE REDUCTION;  Surgeon: Spencer Brodie, MD;  Location: [facility] OR;  Service: ENT;  Laterality: Bilateral;   UVULOPALATOPHARYNGOPLASTY N/A 01/06/2017   Procedure: UVULOPALATOPHARYNGOPLASTY (UPPP);  Surgeon: Spencer Brodie, MD;  Location[facility] OR;  Service: ENT;  Laterality: N/A;   Social History   Tobacco Use   Smoking status: Never   Smokeless tobacco: Never  Substance Use Topics   Alcohol use: No   Drug use: No   History reviewed. No pertinent family history. Allergies  Allergen Reactions   No Known Allergies    Review of Systems  Constitutional:  Positive for malaise/fatigue. Negative for chills and fever.  HENT:  Negative for sore throat.   Respiratory:  Negative for cough and shortness of  breath.   Cardiovascular:  Negative for chest pain, palpitations and leg swelling.  Gastrointestinal:  Negative for abdominal pain, blood in stool, constipation, diarrhea, nausea and vomiting.  Genitourinary:  Negative for dysuria and hematuria.  Musculoskeletal:  Negative for myalgias.  Skin:  Negative for itching and rash.  Neurological:  Negative for dizziness and headaches.  Psychiatric/Behavioral:  Negative for depression and suicidal ideas.      Objective:     BP 137/87   Pulse 92   Ht 5\' 9"  (1.753 m)   Wt (!) 309 lb (140.2 kg)   SpO2 96%   BMI 45.63 kg/m  BP Readings from Last 3 Encounters:  03/15/24 137/87  02/13/24 (!) 165/104  11/04/21 (!) 153/97   Physical Exam Vitals reviewed.  Constitutional:      General: He is not in acute distress.    Appearance: Normal appearance. He is obese. He is not ill-appearing.  HENT:     Head: Normocephalic and atraumatic.     Right Ear: External ear normal.     Left Ear: External ear normal.     Nose: Nose normal. No congestion or rhinorrhea.     Mouth/Throat:     Mouth: Mucous membranes are moist.     Pharynx: Oropharynx is clear.  Eyes:     General: No scleral icterus.    Extraocular Movements: Extraocular movements intact.     Conjunctiva/sclera: Conjunctivae normal.  Pupils: Pupils are equal, round, and reactive to light.  Cardiovascular:     Rate and Rhythm: Normal rate and regular rhythm.     Pulses: Normal pulses.     Heart sounds: Normal heart sounds. No murmur heard. Pulmonary:     Effort: Pulmonary effort is normal.     Breath sounds: Normal breath sounds. No wheezing, rhonchi or rales.  Abdominal:     General: Abdomen is flat. Bowel sounds are normal. There is no distension.     Palpations: Abdomen is soft.     Tenderness: There is no abdominal tenderness.  Musculoskeletal:        General: No swelling or deformity. Normal range of motion.     Cervical back: Normal range of motion.  Skin:    General:  Skin is warm and dry.     Capillary Refill: Capillary refill takes less than 2 seconds.  Neurological:     General: No focal deficit present.     Mental Status: He is alert and oriented to person, place, and time.     Motor: No weakness.  Psychiatric:        Mood and Affect: Mood normal.        Behavior: Behavior normal.        Thought Content: Thought content normal.   Last CBC Lab Results  Component Value Date   WBC 8.1 02/14/2024   HGB 16.9 02/14/2024   HCT 47.9 02/14/2024   MCV 92 02/14/2024   MCH 32.6 02/14/2024   RDW 12.3 02/14/2024   PLT 312 02/14/2024   Last metabolic panel Lab Results  Component Value Date   GLUCOSE 117 (H) 02/14/2024   NA 141 02/14/2024   K 4.7 02/14/2024   CL 104 02/14/2024   CO2 22 02/14/2024   BUN 13 02/14/2024   CREATININE 0.87 02/14/2024   EGFR 108 02/14/2024   CALCIUM 9.2 02/14/2024   PROT 6.7 02/14/2024   ALBUMIN 4.5 02/14/2024   LABGLOB 2.2 02/14/2024   BILITOT 0.4 02/14/2024   ALKPHOS 67 02/14/2024   AST 28 02/14/2024   ALT 59 (H) 02/14/2024   Last lipids Lab Results  Component Value Date   CHOL 230 (H) 02/14/2024   HDL 40 02/14/2024   LDLCALC 164 (H) 02/14/2024   TRIG 144 02/14/2024   CHOLHDL 5.8 (H) 02/14/2024   Last hemoglobin A1c Lab Results  Component Value Date   HGBA1C 6.2 (H) 02/14/2024   Last thyroid functions Lab Results  Component Value Date   TSH 3.430 02/14/2024   Last vitamin D  Lab Results  Component Value Date   VD25OH 32.2 02/14/2024   Last vitamin B12 and Folate Lab Results  Component Value Date   VITAMINB12 541 02/14/2024   FOLATE 13.1 02/14/2024   The 10-year ASCVD risk score (Arnett DK, et al., 2019) is: 4.2%    Assessment & Plan:   Problem List Items Addressed This Visit       Obstructive sleep apnea of adult   He has a documented history of obstructive sleep apnea from March 2018.  He underwent nasal septoplasty at that time.  Not using CPAP currently.  Sleep study information  not available.  He endorses fatigue.  No acute underlying metabolic abnormalities noted to explain symptoms, which may ultimately be due to untreated OSA.  Treatment options reviewed.  Will order home sleep testing today and start Zepbound .  He will return to care in 3 months for reassessment.      Morbid obesity (  HCC) - Primary   Mr. Greenley has lost 14 pounds since his appointment last month.  He has focused on dietary changes and started taking multiple over-the-counter supplements in effort to lose weight and improve his overall health.  He continues to endorse fatigue, which may be due to untreated obstructive sleep apnea.  He endorses a history of sleep apnea.  Records are not available currently. -Additional treatment options reviewed today for management of morbid obesity and obstructive sleep apnea.  Through shared decision making, Zepbound  2.5 mg weekly has been prescribed.  He will continue to focus on dietary changes in an effort to lose weight.  Follow-up in 3 months for reassessment.      Prediabetes   A1c 6.2 on labs from April.  He has made significant dietary changes in light of this result and has lost 14 pounds.  He will continue to focus on healthy dieting habits in an effort to further improve glycemic control.      Hyperlipidemia   Lipid panel updated last month.  Total cholesterol 230 and LDL 164.  His 10-year ASCVD risk is 3.8%.  He has made significant dietary changes in light of these results in an effort to lose weight and lower his blood sugar and cholesterol levels.      Return in about 3 months (around 06/15/2024).   Tobi Fortes, MD

## 2024-03-15 NOTE — Patient Instructions (Signed)
 It was a pleasure to see you today.  Thank you for giving us  the opportunity to be involved in your care.  Below is a brief recap of your visit and next steps.  We will plan to see you again in 3 months.  Summary Home sleep test and Zepbound ordered Keep up the great work with weight loss Follow up in 3 months

## 2024-03-18 ENCOUNTER — Other Ambulatory Visit (HOSPITAL_COMMUNITY): Payer: Self-pay

## 2024-03-18 ENCOUNTER — Telehealth: Payer: Self-pay

## 2024-03-18 NOTE — Telephone Encounter (Signed)
 Copied from CRM [id number]. Topic: Clinical - Medication Prior Auth >> Mar 15, 2024  4:35 PM Crispin Dolphin wrote: Reason for CRM: Patient called states pharmacy told him that the medication tirzepatide (ZEPBOUND) 2.5 MG/0.5ML Pen Requires prior authorization. Thank You

## 2024-03-20 NOTE — Telephone Encounter (Signed)
 Left vm on phone, and wife phone to call office back to see if patient has had a sleep study

## 2024-03-22 ENCOUNTER — Telehealth: Payer: Self-pay

## 2024-03-22 NOTE — Telephone Encounter (Signed)
 Copied from CRM [id number]. Topic: General - Other >> Mar 22, 2024 10:20 AM Elle L wrote: Reason for CRM: The patient was attempting to return a call from Kee Pastel D, CMA regarding his sleep study orders. He would like an update on this today if possible as well as an update for his prior authorization for Mounjaro.

## 2024-03-25 NOTE — Telephone Encounter (Signed)
 [email or web address]

## 2024-03-26 ENCOUNTER — Telehealth: Payer: Self-pay | Admitting: Internal Medicine

## 2024-03-26 ENCOUNTER — Other Ambulatory Visit (HOSPITAL_COMMUNITY): Payer: Self-pay

## 2024-03-26 ENCOUNTER — Telehealth: Payer: Self-pay

## 2024-03-26 NOTE — Telephone Encounter (Signed)
 Spoke to patient, patient advised that his insurance will not cover weight loss medication , he is needing to do another sleep study to see if he qualifies for OSA, and will determine if PA can be approved

## 2024-03-26 NOTE — Telephone Encounter (Signed)
 Copied from CRM [id number]. Topic: Clinical - Prescription Issue >> Mar 26, 2024  3:14 PM Tiffany S wrote: Reason for CRM: Patient is requesting call regarding prior auth for zepbound  please follow up

## 2024-03-27 NOTE — Telephone Encounter (Signed)
 Insurance denied for weight loss. Pt can try sleep study to check for OSA if appropriate but insurance would still require a PA for OSA and may still not cover.

## 2024-03-27 NOTE — Telephone Encounter (Signed)
 Sleep study requested faxed to [facility] yesterday

## 2024-03-29 ENCOUNTER — Telehealth: Payer: Self-pay | Admitting: Pharmacy Technician

## 2024-03-29 ENCOUNTER — Other Ambulatory Visit (HOSPITAL_COMMUNITY): Payer: Self-pay

## 2024-03-29 NOTE — Telephone Encounter (Signed)
Will await decision

## 2024-03-29 NOTE — Telephone Encounter (Signed)
 PA request has been Started. New Encounter has been or will be created for follow up. For additional info see Pharmacy Prior Auth telephone encounter from 03/29/2024.

## 2024-03-29 NOTE — Telephone Encounter (Signed)
 Pharmacy Patient Advocate Encounter   Received notification from RX Request Messages that prior authorization for Zepbound  2.5MG /0.5ML pen-injectors is required/requested.   Insurance verification completed.   The patient is insured through [facility] .   Per test claim: PA required; PA submitted to above mentioned insurance via CoverMyMeds Key/confirmation #/EOC [id number] Status is pending

## 2024-04-02 NOTE — Telephone Encounter (Signed)
 Pharmacy Patient Advocate Encounter  Received notification from [facility] that Prior Authorization for Zepbound  2.5MG /0.5ML pen-injectors has been DENIED.  Full denial letter will be uploaded to the media tab. See denial reason below.   PA #/Case ID/Reference #: Key: [id number]

## 2024-04-05 NOTE — Telephone Encounter (Signed)
 Tried calling wife number "number is not in service"

## 2024-04-10 ENCOUNTER — Other Ambulatory Visit: Payer: Self-pay

## 2024-04-10 ENCOUNTER — Encounter: Payer: Self-pay | Admitting: Internal Medicine

## 2024-04-10 DIAGNOSIS — E785 Hyperlipidemia, unspecified: Secondary | ICD-10-CM | POA: Insufficient documentation

## 2024-04-10 DIAGNOSIS — R7303 Prediabetes: Secondary | ICD-10-CM | POA: Insufficient documentation

## 2024-04-10 NOTE — Assessment & Plan Note (Signed)
 Mr. Cueto has lost 14 pounds since his appointment last month.  He has focused on dietary changes and started taking multiple over-the-counter supplements in effort to lose weight and improve his overall health.  He continues to endorse fatigue, which may be due to untreated obstructive sleep apnea.  He endorses a history of sleep apnea.  Records are not available currently. -Additional treatment options reviewed today for management of morbid obesity and obstructive sleep apnea.  Through shared decision making, Zepbound  2.5 mg weekly has been prescribed.  He will continue to focus on dietary changes in an effort to lose weight.  Follow-up in 3 months for reassessment.

## 2024-04-10 NOTE — Assessment & Plan Note (Signed)
 He has a documented history of obstructive sleep apnea from March 2018.  He underwent nasal septoplasty at that time.  Not using CPAP currently.  Sleep study information not available.  He endorses fatigue.  No acute underlying metabolic abnormalities noted to explain symptoms, which may ultimately be due to untreated OSA.  Treatment options reviewed.  Will order home sleep testing today and start Zepbound .  He will return to care in 3 months for reassessment.

## 2024-04-10 NOTE — Assessment & Plan Note (Signed)
 A1c 6.2 on labs from April.  He has made significant dietary changes in light of this result and has lost 14 pounds.  He will continue to focus on healthy dieting habits in an effort to further improve glycemic control.

## 2024-04-10 NOTE — Assessment & Plan Note (Signed)
 Lipid panel updated last month.  Total cholesterol 230 and LDL 164.  His 10-year ASCVD risk is 3.8%.  He has made significant dietary changes in light of these results in an effort to lose weight and lower his blood sugar and cholesterol levels.

## 2024-04-11 ENCOUNTER — Other Ambulatory Visit: Payer: Self-pay

## 2024-04-11 DIAGNOSIS — G4733 Obstructive sleep apnea (adult) (pediatric): Secondary | ICD-10-CM

## 2024-05-11 ENCOUNTER — Other Ambulatory Visit: Payer: Self-pay

## 2024-05-11 ENCOUNTER — Encounter (HOSPITAL_COMMUNITY): Payer: Self-pay

## 2024-05-11 ENCOUNTER — Emergency Department (HOSPITAL_COMMUNITY): Payer: Worker's Compensation

## 2024-05-11 ENCOUNTER — Emergency Department (HOSPITAL_COMMUNITY)
Admission: EM | Admit: 2024-05-11 | Discharge: 2024-05-12 | Disposition: A | Discharge status: Home / Self Care | Payer: Worker's Compensation | Attending: Emergency Medicine | Admitting: Emergency Medicine

## 2024-05-11 DIAGNOSIS — M25571 Pain in right ankle and joints of right foot: Secondary | ICD-10-CM | POA: Diagnosis not present

## 2024-05-11 DIAGNOSIS — W19XXXA Unspecified fall, initial encounter: Secondary | ICD-10-CM

## 2024-05-11 DIAGNOSIS — M25561 Pain in right knee: Secondary | ICD-10-CM | POA: Diagnosis present

## 2024-05-11 DIAGNOSIS — W010XXA Fall on same level from slipping, tripping and stumbling without subsequent striking against object, initial encounter: Secondary | ICD-10-CM | POA: Insufficient documentation

## 2024-05-11 MED ORDER — IBUPROFEN 800 MG PO TABS
800.0000 mg | ORAL_TABLET | Freq: Once | ORAL | Status: AC
  Administered 2024-05-12: 800 mg via ORAL
  Filled 2024-05-11 (×2): qty 1

## 2024-05-11 MED ORDER — IBUPROFEN 800 MG PO TABS: 800.0000 mg | ORAL_TABLET | Freq: Three times a day (TID) | ORAL | 0 refills | Status: DC

## 2024-05-11 NOTE — ED Triage Notes (Signed)
 Pt was at work and tripped over a cable. Pt landed on his R ankle and knee. Pt unable to bear weight on extremity.

## 2024-05-11 NOTE — ED Provider Notes (Signed)
 [facility] Emergency Department Provider Note MRN:  [id number]  Arrival date & time: 05/12/24     Chief Complaint   Leg Injury   History of Present Illness   Spencer Bowman is a 48 y.o. year-old male presents to the ED with chief complaint of trip and fall.  States that he was working today and tripped over a cable and landed on his foot and knee.  States that he tried to walk it off, but wasn't able to do so.  States that the fall happened around 7pm.  Denies any other injuries.  .  History provided by patient.   Review of Systems  Pertinent positive and negative review of systems noted in HPI.    Physical Exam   Vitals:   05/11/24 2053  BP: (!) 161/102  Pulse: 93  Resp: 20  Temp: 98.8 F (37.1 C)  SpO2: 98%    CONSTITUTIONAL:  non toxic-appearing, NAD NEURO:  Alert and oriented x 3, CN 3-12 grossly intact EYES:  eyes equal and reactive ENT/NECK:  Supple, no stridor  CARDIO:  normal rate, appears well-perfused  PULM:  No respiratory distress GI/GU:  non-distended,  MSK/SPINE:  No gross deformities, no edema, moves all extremities, mild swelling to the right ankle, Achilles tendon appears intact, there is tenderness over the deltoid ligament SKIN:  no rash, atraumatic   *Additional and/or pertinent findings included in MDM below  Diagnostic and Interventional Summary    EKG Interpretation Date/Time:    Ventricular Rate:    PR Interval:    QRS Duration:    QT Interval:    QTC Calculation:   R Axis:      Text Interpretation:         Labs Reviewed - No data to display  DG Knee Right Port  Final Result    DG Ankle Complete Right  Final Result      Medications  ibuprofen  (ADVIL ) tablet 800 mg (800 mg Oral Given 05/12/24 0021)     Procedures  /  Critical Care Procedures  ED Course and Medical Decision Making  I have reviewed the triage vital signs, the nursing notes, and pertinent available records from the EMR.  Social  Determinants Affecting Complexity of Care: Patient has no clinically significant social determinants affecting this chief complaint..   ED Course:    Medical Decision Making Patient presents with injury to right ankle.  DDx includes, fracture, strain, or sprain.  Consultants: none  Plain films reveal no fracture.  Pt advised to follow up with PCP and/or orthopedics. Patient given crutches and cam boot while in ED, conservative therapy such as RICE recommended and discussed.   Patient will be discharged home & is agreeable with above plan. Returns precautions discussed. Pt appears safe for discharge.   Amount and/or Complexity of Data Reviewed Radiology: ordered.  Risk Prescription drug management.         Consultants: No consultations were needed in caring for this patient.   Treatment and Plan: Emergency department workup does not suggest an emergent condition requiring admission or immediate intervention beyond  what has been performed at this time. The patient is safe for discharge and has  been instructed to return immediately for worsening symptoms, change in  symptoms or any other concerns    Final Clinical Impressions(s) / ED Diagnoses     ICD-10-CM   1. Fall, initial encounter  W19.XXXA     2. Acute pain of right knee  M25.561  3. Acute right ankle pain  M25.571       ED Discharge Orders          Ordered    ibuprofen  (ADVIL ) 800 MG tablet  3 times daily,   Status:  Discontinued        05/11/24 2348    ibuprofen  (ADVIL ) 800 MG tablet  3 times daily        05/11/24 2348              Discharge Instructions Discussed with and Provided to Patient:   Discharge Instructions   None      Vicky Charleston, PA-C 05/12/24 0600    Cardama, Raynell Moder, MD 05/12/24 [phone]

## 2024-06-05 ENCOUNTER — Telehealth: Payer: Self-pay | Admitting: Neurology

## 2024-06-05 NOTE — Telephone Encounter (Signed)
 Appointment details confirmed

## 2024-06-06 ENCOUNTER — Encounter: Payer: Self-pay | Admitting: Neurology

## 2024-06-06 ENCOUNTER — Telehealth: Payer: Self-pay | Admitting: Neurology

## 2024-06-06 ENCOUNTER — Ambulatory Visit (INDEPENDENT_AMBULATORY_CARE_PROVIDER_SITE_OTHER): Admitting: Neurology

## 2024-06-06 VITALS — BP 141/91 | HR 89 | Ht 69.5 in | Wt 303.0 lb

## 2024-06-06 DIAGNOSIS — R030 Elevated blood-pressure reading, without diagnosis of hypertension: Secondary | ICD-10-CM | POA: Diagnosis not present

## 2024-06-06 DIAGNOSIS — G4763 Sleep related bruxism: Secondary | ICD-10-CM | POA: Diagnosis not present

## 2024-06-06 DIAGNOSIS — G4719 Other hypersomnia: Secondary | ICD-10-CM

## 2024-06-06 DIAGNOSIS — G4733 Obstructive sleep apnea (adult) (pediatric): Secondary | ICD-10-CM | POA: Diagnosis not present

## 2024-06-06 DIAGNOSIS — R635 Abnormal weight gain: Secondary | ICD-10-CM

## 2024-06-06 DIAGNOSIS — R0681 Apnea, not elsewhere classified: Secondary | ICD-10-CM

## 2024-06-06 DIAGNOSIS — R519 Headache, unspecified: Secondary | ICD-10-CM

## 2024-06-06 NOTE — Patient Instructions (Addendum)

## 2024-06-06 NOTE — Progress Notes (Signed)
 Subjective:    Patient ID: Spencer Bowman is a 48 y.o. male.  HPI    True Mar, MD, PhD [facility] [address] [address] [place], [place] [address]  Dear Dr. Melvenia,  I saw your patient, Spencer Bowman, upon your kind request in my sleep clinic today for initial consultation of his sleep disorder, in particular, evaluation of his prior diagnosis of obstructive sleep apnea.  The patient is unaccompanied today.  As you know, [name] is a 48 year old male with an underlying medical history of hypertension, right knee pain, right ankle pain, and severe obesity with a BMI of over 40, who reports snoring and excessive daytime somnolence.  His Epworth sleepiness score is 16 out of 24, fatigue severity score is 62 out of 63.  He was diagnosed with obstructive sleep apnea in 2018.  He is currently not on PAP therapy.  He had a home sleep test on 11/28/2016 which showed a total AHI of 48.2/h, O2 nadir 72%.  I reviewed your office note from 02/13/2024. He had septoplasty and bilateral turbinate reduction in 2018, along with UPPP.  He has not been on PAP therapy.  He would be willing to consider PAP therapy and getting treated for sleep apnea at this point.  He has had weight gain, he has had worsening daytime somnolence and his wife has repeatedly needed to nudge him because of his apneas at night.  He lives with his family including wife and daughter who just graduated college, he also has a son who is on his own.  Patient works in [employer].  He has injured his right ankle and may need surgery, he is supposed to see a surgeon on 06/17/2024.  He quit smoking over 15 years ago, probably in his 78s.  He drinks caffeine in the form of coffee, 2 to 3 cups/day, he drinks alcohol occasionally or on special occasions.  Bedtime is around 11 and rise time around 6.  He has no nightly nocturia but has had morning headaches.  He is not aware of any family history of sleep apnea.  He has a  history of bruxism and for the past year has been wearing a dentist made occlusive guard.  His Past Medical History Is Significant For: Past Medical History:  Diagnosis Date   Sleep apnea    no cpap    His Past Surgical History Is Significant For: Past Surgical History:  Procedure Laterality Date   DENTAL SURGERY     wisdom teeth   NASAL SEPTOPLASTY W/ TURBINOPLASTY Bilateral 01/06/2017   Procedure: NASAL SEPTOPLASTY WITH BILATERAL TURBINATE REDUCTION;  Surgeon: Lonni FORBES Angle, MD;  Location: [facility] OR;  Service: ENT;  Laterality: Bilateral;   UVULOPALATOPHARYNGOPLASTY N/A 01/06/2017   Procedure: UVULOPALATOPHARYNGOPLASTY (UPPP);  Surgeon: Lonni FORBES Angle, MD;  Location: [facility] OR;  Service: ENT;  Laterality: N/A;    His Family History Is Significant For: Family History  Family history unknown: Yes    His Social History Is Significant For: Social History   Socioeconomic History   Marital status: Married    Spouse name: Not on file   Number of children: Not on file   Years of education: Not on file   Highest education level: Not on file  Occupational History   Not on file  Tobacco Use   Smoking status: Never   Smokeless tobacco: Never  Substance and Sexual Activity   Alcohol use: No   Drug use: No   Sexual  activity: Not on file  Other Topics Concern   Not on file  Social History Narrative   Lives at home with wife, daughter   Right handed   Caffeine: 1-2 cups coffee in the morning   Social Drivers of Health   Financial Resource Strain: Not on file  Food Insecurity: Not on file  Transportation Needs: Not on file  Physical Activity: Not on file  Stress: Not on file  Social Connections: Not on file    His Allergies Are:  Allergies  Allergen Reactions   No Known Allergies   :   His Current Medications Are:  Outpatient Encounter Medications as of 06/06/2024  Medication Sig   OVER THE COUNTER MEDICATION vitamins, names unknown   UNABLE TO FIND Med Name:  Vitamin B   UNABLE TO FIND Med Name: TMG amino acid   ibuprofen  (ADVIL ) 800 MG tablet Take 1 tablet (800 mg total) by mouth 3 (three) times daily. (Patient not taking: Reported on 06/06/2024)   No facility-administered encounter medications on file as of 06/06/2024.  :   Review of Systems:  Out of a complete 14 point review of systems, all are reviewed and negative with the exception of these symptoms as listed below:  Review of Systems  Neurological:        Patient is here alone for sleep consult. He states his wife has had to nudge him while sleeping a few times in a week due to apnea. He states he was previously told that he had sleep apnea. He had surgery done on his nose and throat to prevent snoring, which worked for some time but no longer. He states he can doze off in random places. If sits too long he will fall asleep. He does not have any family history of sleep apnea. ESS 16 FSS 62    Objective:  Neurological Exam  Physical Exam Physical Examination:   Vitals:   06/06/24 1218  BP: (!) 141/91  Pulse: 89    General Examination: The patient is a very pleasant 48 y.o. male in no acute distress. He appears well-developed and well-nourished and well groomed.   HEENT: Normocephalic, atraumatic, pupils are equal, round and reactive to light, extraocular tracking is good without limitation to gaze excursion or nystagmus noted. Hearing is grossly intact. Face is symmetric with normal facial animation. Speech is clear with no dysarthria noted. There is no hypophonia. There is no lip, neck/head, jaw or voice tremor. Neck is supple with full range of passive and active motion. There are no carotid bruits on auscultation. Oropharynx exam reveals: mild mouth dryness, adequate dental hygiene and moderate airway crowding, due to small airway and scarring, status post tonsillectomy and UPPP.  Neck circumference 19-1/2 inches, moderate to significant overbite noted.  Chest: Clear to  auscultation without wheezing, rhonchi or crackles noted.  Heart: S1+S2+0, regular and normal without murmurs, rubs or gallops noted.   Abdomen: Soft, non-tender and non-distended.  Extremities: There is no pitting edema in the left distal lower extremity, right foot in the boot.    Skin: Warm and dry without trophic changes noted.   Musculoskeletal: exam reveals no obvious joint deformities, right foot in a boot.   Neurologically:  Mental status: The patient is awake, alert and oriented in all 4 spheres. His immediate and remote memory, attention, language skills and fund of knowledge are appropriate. There is no evidence of aphasia, agnosia, apraxia or anomia. Speech is clear with normal prosody and enunciation. Thought process is  linear. Mood is normal and affect is normal.  Cranial nerves II - XII are as described above under HEENT exam.  Motor exam: Normal bulk, moving all 4 extremities without restriction, except right foot. There is no obvious action or resting tremor.  Fine motor skills and coordination: grossly intact.  Cerebellar testing: No dysmetria or intention tremor. There is no truncal or gait ataxia.  Sensory exam: intact to light touch in the upper and lower extremities.  Gait, station and balance: He stands with mild difficulty and walks with a crutch.    Assessment and Plan:  In summary, ENRICO EADDY is a very pleasant 48 y.o.-year old male with an underlying medical history of hypertension, right knee pain, right ankle pain, and severe obesity with a BMI of over 40, whose history and physical exam are concerning for sleep disordered breathing, particularly obstructive sleep apnea (OSA).  He was previously diagnosed with severe obstructive sleep apnea but has not been on PAP therapy.  While a laboratory attended sleep study is typically considered gold standard for evaluation of sleep disordered breathing, we mutually agreed to proceed with a home sleep test at this  time.  We may have to schedule the home sleep test around his possible ankle surgery if he needs surgery. I had a long chat with the patient about my findings and the diagnosis of sleep apnea, particularly OSA, its prognosis and treatment options. We talked about medical/conservative treatments, surgical interventions and non-pharmacological approaches for symptom control. I explained, in particular, the risks and ramifications of untreated moderate to severe OSA, especially with respect to developing cardiovascular disease down the road, including congestive heart failure (CHF), difficult to treat hypertension, cardiac arrhythmias (particularly A-fib), neurovascular complications including TIA, stroke and dementia. Even type 2 diabetes has, in part, been linked to untreated OSA. Symptoms of untreated OSA may include (but may not be limited to) daytime sleepiness, nocturia (i.e. frequent nighttime urination), memory problems, mood irritability and suboptimally controlled or worsening mood disorder such as depression and/or anxiety, lack of energy, lack of motivation, physical discomfort, as well as recurrent headaches, especially morning or nocturnal headaches. We talked about the importance of maintaining a healthy lifestyle and striving for healthy weight.  I recommended a sleep study at this time. I outlined the differences between a laboratory attended sleep study which is considered more comprehensive and accurate over the option of a home sleep test (HST); the latter may lead to underestimation of sleep disordered breathing in some instances and does not help with diagnosing upper airway resistance syndrome and is not accurate enough to diagnose primary central sleep apnea typically. I outlined possible surgical and non-surgical treatment options of OSA, including the use of a positive airway pressure (PAP) device (i.e. CPAP, AutoPAP/APAP or BiPAP in certain circumstances), a custom-made dental device (aka  oral appliance, which would require a referral to a specialist dentist or orthodontist typically, and is generally speaking not considered for patients with full dentures or edentulous state), upper airway surgical options, such as traditional UPPP (which is not considered a first-line treatment typically and he is already status post airway surgery) or the Inspire device (hypoglossal nerve stimulator, which would involve a referral for consultation with an ENT surgeon, after careful selection, following inclusion criteria - also not first-line treatment). I explained the PAP treatment option to the patient in detail, as this is generally considered first-line treatment.  The patient indicated that he would be willing to try PAP therapy, if the need  arises. I explained the importance of being compliant with PAP treatment, not only for insurance purposes but primarily to improve patient's symptoms symptoms, and for the patient's long term health benefit, including to reduce His cardiovascular risks longer-term.    We will pick up our discussion about the next steps and treatment options after testing.  We will keep him posted as to the test results by phone call and/or MyChart messaging where possible.  We will plan to follow-up in sleep clinic accordingly as well.  I answered all his questions today and the patient was in agreement.   I encouraged him to call with any interim questions, concerns, problems or updates or email us  through MyChart.  Generally speaking, sleep test authorizations may take up to 2 weeks, sometimes less, sometimes longer, the patient is encouraged to get in touch with us  if they do not hear back from the sleep lab staff directly within the next 2 weeks.  Thank you very much for allowing me to participate in the care of this nice patient. If I can be of any further assistance to you please do not hesitate to call me at [phone].  Sincerely,   True Mar, MD, PhD

## 2024-06-06 NOTE — Telephone Encounter (Signed)
Patient called to verify appointment time °

## 2024-06-18 ENCOUNTER — Ambulatory Visit

## 2024-06-24 ENCOUNTER — Other Ambulatory Visit: Payer: Self-pay | Admitting: Otolaryngology

## 2024-06-26 ENCOUNTER — Other Ambulatory Visit: Payer: Self-pay

## 2024-06-26 ENCOUNTER — Encounter (HOSPITAL_COMMUNITY): Payer: Self-pay | Admitting: Otolaryngology

## 2024-06-26 NOTE — Progress Notes (Signed)
 PCP - Melvenia Manus BRAVO, MD  Cardiologist -   PPM/ICD - denies Device Orders - n/a Rep Notified - n/a  Chest x-ray - denies EKG - denies Stress Test - denies ECHO - denies Cardiac Cath - denies  CPAP - denies Home sleep test- 11-28-16  Dm-denies   Blood Thinner Instructions: denies Aspirin Instructions: Last dose 06-26-24  ERAS Protcol - clear liquids until 9:30 am.   COVID TEST- no   Anesthesia review: no  Patient verbally denies any shortness of breath, fever, cough and chest pain during phone call   -------------  SDW INSTRUCTIONS given:  Your procedure is scheduled on June 28, 2024.  Report to [facility] Main Entrance A at 10:00 A.M., and check in at the Admitting office.  Call this number if you have problems the morning of surgery:  [phone]   Remember:  Do not eat after midnight the night before your surgery  You may drink clear liquids until 9:30 the morning of your surgery.   Clear liquids allowed are: Water, Non-Citrus Juices (without pulp), Carbonated Beverages, Clear Tea, Black Coffee Only, and Gatorade    Take these medicines the morning of surgery with A SIP OF WATER NONE  As of today, STOP taking any Aspirin (unless otherwise instructed by your surgeon) Aleve, Naproxen, Ibuprofen , Motrin , Advil , Goody's, BC's, all herbal medications, fish oil, and all vitamins.                      Do not wear jewelry, make up, or nail polish            Do not wear lotions, powders, perfumes/colognes, or deodorant.            Do not shave 48 hours prior to surgery.  Men may shave face and neck.            Do not bring valuables to the hospital.            [facility] is not responsible for any belongings or valuables.  Do NOT Smoke (Tobacco/Vaping) 24 hours prior to your procedure If you use a CPAP at night, you may bring all equipment for your overnight stay.   Contacts, glasses, dentures or bridgework may not be worn into surgery.      For patients  admitted to the hospital, discharge time will be determined by your treatment team.   Patients discharged the day of surgery will not be allowed to drive home, and someone needs to stay with them for 24 hours.    Special instructions:   - Preparing For Surgery  Before surgery, you can play an important role. Because skin is not sterile, your skin needs to be as free of germs as possible. You can reduce the number of germs on your skin by washing with CHG (chlorahexidine gluconate) Soap before surgery.  CHG is an antiseptic cleaner which kills germs and [place] with the skin to continue killing germs even after washing.    Oral Hygiene is also important to reduce your risk of infection.  Remember - BRUSH YOUR TEETH THE MORNING OF SURGERY WITH YOUR REGULAR TOOTHPASTE  Please do not use if you have an allergy to CHG or antibacterial soaps. If your skin becomes reddened/irritated stop using the CHG.  Do not shave (including legs and underarms) for at least 48 hours prior to first CHG shower. It is OK to shave your face.  Please follow these instructions carefully.   Shower  the NIGHT BEFORE SURGERY and the MORNING OF SURGERY with DIAL Soap.   Pat yourself dry with a CLEAN TOWEL.  Wear CLEAN PAJAMAS to bed the night before surgery  Place CLEAN SHEETS on your bed the night of your first shower and DO NOT SLEEP WITH PETS.   Day of Surgery: Please shower morning of surgery  Wear Clean/Comfortable clothing the morning of surgery Do not apply any deodorants/lotions.   Remember to brush your teeth WITH YOUR REGULAR TOOTHPASTE.   Questions were answered. Patient verbalized understanding of instructions.

## 2024-06-28 ENCOUNTER — Encounter (HOSPITAL_COMMUNITY)
Admission: RE | Disposition: A | Discharge status: Home / Self Care | Payer: Self-pay | Source: Home / Self Care | Attending: Otolaryngology

## 2024-06-28 ENCOUNTER — Ambulatory Visit (HOSPITAL_COMMUNITY): Admitting: Anesthesiology

## 2024-06-28 ENCOUNTER — Encounter (HOSPITAL_COMMUNITY): Payer: Self-pay | Admitting: Otolaryngology

## 2024-06-28 ENCOUNTER — Ambulatory Visit (HOSPITAL_COMMUNITY)
Admission: RE | Admit: 2024-06-28 | Discharge: 2024-06-28 | Disposition: A | Discharge status: Home / Self Care | Attending: Otolaryngology | Admitting: Otolaryngology

## 2024-06-28 ENCOUNTER — Other Ambulatory Visit: Payer: Self-pay

## 2024-06-28 DIAGNOSIS — E66813 Obesity, class 3: Secondary | ICD-10-CM | POA: Diagnosis not present

## 2024-06-28 DIAGNOSIS — Z6841 Body Mass Index (BMI) 40.0 and over, adult: Secondary | ICD-10-CM | POA: Insufficient documentation

## 2024-06-28 DIAGNOSIS — D170 Benign lipomatous neoplasm of skin and subcutaneous tissue of head, face and neck: Secondary | ICD-10-CM

## 2024-06-28 DIAGNOSIS — M952 Other acquired deformity of head: Secondary | ICD-10-CM

## 2024-06-28 DIAGNOSIS — G4730 Sleep apnea, unspecified: Secondary | ICD-10-CM | POA: Insufficient documentation

## 2024-06-28 DIAGNOSIS — R221 Localized swelling, mass and lump, neck: Secondary | ICD-10-CM

## 2024-06-28 LAB — CBC
HCT: 48.2 % (ref 39.0–52.0)
Hemoglobin: 16.7 g/dL (ref 13.0–17.0)
MCH: 31.3 pg (ref 26.0–34.0)
MCHC: 34.6 g/dL (ref 30.0–36.0)
MCV: 90.3 fL (ref 80.0–100.0)
Platelets: 311 K/uL (ref 150–400)
RBC: 5.34 MIL/uL (ref 4.22–5.81)
RDW: 12.7 % (ref 11.5–15.5)
WBC: 7.8 K/uL (ref 4.0–10.5)
nRBC: 0 % (ref 0.0–0.2)

## 2024-06-28 SURGERY — EXCISION, MASS, HEAD
Anesthesia: General | Laterality: Right

## 2024-06-28 MED ORDER — FENTANYL CITRATE (PF) 250 MCG/5ML IJ SOLN
INTRAMUSCULAR | Status: DC | PRN
  Administered 2024-06-28: 50 ug via INTRAVENOUS
  Administered 2024-06-28: 100 ug via INTRAVENOUS

## 2024-06-28 MED ORDER — LIDOCAINE 2% (20 MG/ML) 5 ML SYRINGE
INTRAMUSCULAR | Status: AC
  Filled 2024-06-28: qty 25

## 2024-06-28 MED ORDER — ROCURONIUM BROMIDE 10 MG/ML (PF) SYRINGE
PREFILLED_SYRINGE | INTRAVENOUS | Status: DC | PRN
  Administered 2024-06-28: 50 mg via INTRAVENOUS

## 2024-06-28 MED ORDER — DEXAMETHASONE SODIUM PHOSPHATE 10 MG/ML IJ SOLN
INTRAMUSCULAR | Status: AC
  Filled 2024-06-28: qty 4

## 2024-06-28 MED ORDER — PHENYLEPHRINE HCL (PRESSORS) 10 MG/ML IV SOLN
INTRAVENOUS | Status: DC | PRN
  Administered 2024-06-28: 80 ug via INTRAVENOUS
  Administered 2024-06-28: 160 ug via INTRAVENOUS

## 2024-06-28 MED ORDER — DEXAMETHASONE SODIUM PHOSPHATE 10 MG/ML IJ SOLN
INTRAMUSCULAR | Status: DC | PRN
  Administered 2024-06-28: 10 mg via INTRAVENOUS

## 2024-06-28 MED ORDER — ROCURONIUM BROMIDE 10 MG/ML (PF) SYRINGE
PREFILLED_SYRINGE | INTRAVENOUS | Status: AC
  Filled 2024-06-28: qty 50

## 2024-06-28 MED ORDER — SUGAMMADEX SODIUM 200 MG/2ML IV SOLN
INTRAVENOUS | Status: DC | PRN
  Administered 2024-06-28: 280 mg via INTRAVENOUS

## 2024-06-28 MED ORDER — CEFAZOLIN SODIUM-DEXTROSE 2-4 GM/100ML-% IV SOLN
2.0000 g | INTRAVENOUS | Status: DC
  Filled 2024-06-28: qty 100

## 2024-06-28 MED ORDER — OXYCODONE HCL 5 MG/5ML PO SOLN: 5.0000 mg | Freq: Once | ORAL | Status: DC | PRN

## 2024-06-28 MED ORDER — 0.9 % SODIUM CHLORIDE (POUR BTL) OPTIME
TOPICAL | Status: DC | PRN
  Administered 2024-06-28: 1000 mL

## 2024-06-28 MED ORDER — FENTANYL CITRATE (PF) 100 MCG/2ML IJ SOLN: 25.0000 ug | INTRAMUSCULAR | Status: DC | PRN

## 2024-06-28 MED ORDER — LIDOCAINE-EPINEPHRINE 1 %-1:100000 IJ SOLN
INTRAMUSCULAR | Status: DC | PRN
  Administered 2024-06-28: 10 mL

## 2024-06-28 MED ORDER — CHLORHEXIDINE GLUCONATE 0.12 % MT SOLN
15.0000 mL | Freq: Once | OROMUCOSAL | Status: AC
  Administered 2024-06-28: 15 mL via OROMUCOSAL
  Filled 2024-06-28: qty 15

## 2024-06-28 MED ORDER — LACTATED RINGERS IV SOLN: INTRAVENOUS | Status: DC

## 2024-06-28 MED ORDER — EPHEDRINE 5 MG/ML INJ
INTRAVENOUS | Status: AC
  Filled 2024-06-28: qty 15

## 2024-06-28 MED ORDER — HYDROCODONE-ACETAMINOPHEN 5-325 MG PO TABS: 1.0000 | ORAL_TABLET | Freq: Four times a day (QID) | ORAL | 0 refills | Status: AC | PRN

## 2024-06-28 MED ORDER — LACTATED RINGERS IV SOLN
INTRAVENOUS | Status: DC | PRN
Start: 2024-06-28 — End: 2024-06-28

## 2024-06-28 MED ORDER — FENTANYL CITRATE (PF) 250 MCG/5ML IJ SOLN
INTRAMUSCULAR | Status: AC
  Filled 2024-06-28: qty 5

## 2024-06-28 MED ORDER — ONDANSETRON HCL 4 MG/2ML IJ SOLN
INTRAMUSCULAR | Status: AC
  Filled 2024-06-28: qty 8

## 2024-06-28 MED ORDER — PHENYLEPHRINE 80 MCG/ML (10ML) SYRINGE FOR IV PUSH (FOR BLOOD PRESSURE SUPPORT)
PREFILLED_SYRINGE | INTRAVENOUS | Status: DC | PRN
  Administered 2024-06-28: 160 ug via INTRAVENOUS

## 2024-06-28 MED ORDER — MIDAZOLAM HCL 2 MG/2ML IJ SOLN
INTRAMUSCULAR | Status: AC
  Filled 2024-06-28: qty 2

## 2024-06-28 MED ORDER — ONDANSETRON HCL 4 MG/2ML IJ SOLN: 4.0000 mg | Freq: Four times a day (QID) | INTRAMUSCULAR | Status: DC | PRN

## 2024-06-28 MED ORDER — ORAL CARE MOUTH RINSE: 15.0000 mL | Freq: Once | OROMUCOSAL | Status: AC

## 2024-06-28 MED ORDER — CEFAZOLIN SODIUM-DEXTROSE 3-4 GM/150ML-% IV SOLN
3.0000 g | INTRAVENOUS | Status: AC
  Administered 2024-06-28: 3 g via INTRAVENOUS

## 2024-06-28 MED ORDER — PROPOFOL 10 MG/ML IV BOLUS
INTRAVENOUS | Status: AC
  Filled 2024-06-28: qty 20

## 2024-06-28 MED ORDER — BACITRACIN ZINC 500 UNIT/GM EX OINT
TOPICAL_OINTMENT | CUTANEOUS | Status: AC
  Filled 2024-06-28: qty 28.35

## 2024-06-28 MED ORDER — MIDAZOLAM HCL 2 MG/2ML IJ SOLN
INTRAMUSCULAR | Status: DC | PRN
  Administered 2024-06-28: 2 mg via INTRAVENOUS

## 2024-06-28 MED ORDER — PHENYLEPHRINE HCL-NACL 20-0.9 MG/250ML-% IV SOLN
INTRAVENOUS | Status: DC | PRN
  Administered 2024-06-28: 40 ug/min via INTRAVENOUS

## 2024-06-28 MED ORDER — PROPOFOL 10 MG/ML IV BOLUS
INTRAVENOUS | Status: DC | PRN
  Administered 2024-06-28: 200 mg via INTRAVENOUS

## 2024-06-28 MED ORDER — CEFAZOLIN SODIUM-DEXTROSE 3-4 GM/150ML-% IV SOLN
INTRAVENOUS | Status: AC
  Filled 2024-06-28: qty 150

## 2024-06-28 MED ORDER — PHENYLEPHRINE 80 MCG/ML (10ML) SYRINGE FOR IV PUSH (FOR BLOOD PRESSURE SUPPORT)
PREFILLED_SYRINGE | INTRAVENOUS | Status: AC
  Filled 2024-06-28: qty 30

## 2024-06-28 MED ORDER — LIDOCAINE 2% (20 MG/ML) 5 ML SYRINGE
INTRAMUSCULAR | Status: DC | PRN
  Administered 2024-06-28: 80 mg via INTRAVENOUS

## 2024-06-28 MED ORDER — OXYCODONE HCL 5 MG PO TABS: 5.0000 mg | ORAL_TABLET | Freq: Once | ORAL | Status: DC | PRN

## 2024-06-28 MED ORDER — ONDANSETRON HCL 4 MG/2ML IJ SOLN
INTRAMUSCULAR | Status: DC | PRN
  Administered 2024-06-28: 4 mg via INTRAVENOUS

## 2024-06-28 SURGICAL SUPPLY — 28 items
APPLICATOR DR MATTHEWS STRL (MISCELLANEOUS) IMPLANT
BAG COUNTER SPONGE SURGICOUNT (BAG) ×1 IMPLANT
BLADE SURG 15 STRL LF DISP TIS (BLADE) IMPLANT
CLEANER TIP ELECTROSURG 2X2 (MISCELLANEOUS) ×1 IMPLANT
COVER SURGICAL LIGHT HANDLE (MISCELLANEOUS) ×1 IMPLANT
DERMABOND ADVANCED .7 DNX12 (GAUZE/BANDAGES/DRESSINGS) ×1 IMPLANT
DRAPE HALF SHEET 40X57 (DRAPES) IMPLANT
ELECT COATED BLADE 2.86 ST (ELECTRODE) ×1 IMPLANT
ELECTRODE REM PT RTRN 9FT ADLT (ELECTROSURGICAL) IMPLANT
GAUZE 4X4 16PLY ~~LOC~~+RFID DBL (SPONGE) ×1 IMPLANT
GLOVE BIO SURGEON STRL SZ7.5 (GLOVE) ×1 IMPLANT
GLOVE BIOGEL PI IND STRL 8 (GLOVE) ×1 IMPLANT
GOWN STRL REUS W/ TWL LRG LVL3 (GOWN DISPOSABLE) ×1 IMPLANT
GOWN STRL REUS W/ TWL XL LVL3 (GOWN DISPOSABLE) ×1 IMPLANT
KIT BASIN OR (CUSTOM PROCEDURE TRAY) ×1 IMPLANT
KIT TURNOVER KIT B (KITS) ×1 IMPLANT
NDL 27GX1/2 REG BEVEL ECLIP (NEEDLE) ×1 IMPLANT
NEEDLE 27GX1/2 REG BEVEL ECLIP (NEEDLE) ×1 IMPLANT
NS IRRIG 1000ML POUR BTL (IV SOLUTION) ×1 IMPLANT
PAD ARMBOARD POSITIONER FOAM (MISCELLANEOUS) ×2 IMPLANT
PENCIL SMOKE EVACUATOR (MISCELLANEOUS) ×1 IMPLANT
SUT ETHILON 5 0 PS 2 18 (SUTURE) IMPLANT
SUT SILK 3 0 TIES 10X30 (SUTURE) IMPLANT
SUT VIC AB 3-0 SH 27X BRD (SUTURE) IMPLANT
SUT VIC AB 4-0 PS2 18 (SUTURE) IMPLANT
SYR CONTROL 10ML LL (SYRINGE) ×1 IMPLANT
TOWEL GREEN STERILE (TOWEL DISPOSABLE) ×1 IMPLANT
TRAY ENT MC OR (CUSTOM PROCEDURE TRAY) ×1 IMPLANT

## 2024-06-28 NOTE — Anesthesia Procedure Notes (Signed)
 Procedure Name: Intubation Date/Time: 06/28/2024 1:18 PM  Performed by: Scherrie Mast, CRNAPre-anesthesia Checklist: Patient identified, Emergency Drugs available, Suction available and Patient being monitored Patient Re-evaluated:Patient Re-evaluated prior to induction Oxygen Delivery Method: Circle System Utilized Preoxygenation: Pre-oxygenation with 100% oxygen Induction Type: IV induction Ventilation: Mask ventilation without difficulty Laryngoscope Size: Glidescope and 4 Grade View: Grade I Tube type: Oral Tube size: 7.0 mm Number of attempts: 1 Airway Equipment and Method: Stylet and Oral airway Placement Confirmation: ETT inserted through vocal cords under direct vision, positive ETCO2 and breath sounds checked- equal and bilateral Tube secured with: Tape Dental Injury: Teeth and Oropharynx as per pre-operative assessment

## 2024-06-28 NOTE — Anesthesia Postprocedure Evaluation (Signed)
 Anesthesia Post Note  Patient: Spencer Bowman  Procedure(s) Performed: EXCISION, MASS, HEAD (Right)     Patient location during evaluation: PACU Anesthesia Type: General Level of consciousness: awake and alert Pain management: pain level controlled Vital Signs Assessment: post-procedure vital signs reviewed and stable Respiratory status: spontaneous breathing, nonlabored ventilation, respiratory function stable and patient connected to nasal cannula oxygen Cardiovascular status: blood pressure returned to baseline and stable Postop Assessment: no apparent nausea or vomiting Anesthetic complications: no   No notable events documented.  Last Vitals:  Vitals:   06/28/24 1515 06/28/24 1518  BP: (!) 145/111 (!) 152/99  Pulse: 86 89  Resp: 17 18  Temp:  37.2 C  SpO2: 96% 95%    Last Pain:  Vitals:   06/28/24 1515  TempSrc:   PainSc: 0-No pain   Pain Goal:                   Rome Ade

## 2024-06-28 NOTE — Discharge Instructions (Signed)
 Post-operative Patient Instructions Spencer Bowman. Hoshal MD  Surgery What to expect: A bandage will be placed on your surgical sites. You can leave the bandages in place until you return to the clinic. You may be scheduled for a series of wound care appointments over the next month.  Recovery/Restrictions: -No strenuous activity for at least the first week after your procedure -Bruising and swelling are expected and will take weeks to go away (consider using ice packs) -Please contact our office immediately if you experience any signs/symptoms of infection (redness, pain, or fever of 100.53F or greater)  Wound Wound care: The goal is to keep your wounds clean and moist to prevent scabs or crusts. You will keep your current post-operative dressing in place undisturbed until after your first post-operative clinic visit. The following instructions apply after your first post-operative visit.  1. Clean wound wounds with soap and water using a cue tip if any crusts are present  2. Next, apply a thin layer of Aquaphor ointment 3. Apply Telfa and cover with brown tape  4. If you had an ear surgery - please apply a thin layer of antibiotic ointment or Aquaphor ointment to your ear incision every day  Care Healing Period: For the best healing, please protect the area from the sun   You may be asked to begin massaging the scars several weeks after surgery. Scars can be massaged in horizontal, vertical, and circular motions.   Adult Post-Operative Pain Management  Pain medication is given immediately following your surgery to help with post-operative pain. Do not wake up or set an alarm to wake up and take pain medications. Sleep and rest.  Upon your discharge home, we suggest scheduled doses of Acetaminophen (Tylenol) every 6 hours and Ibuprofen (Motrin) every 6 hours, alternating between medications every 3 hours (i.e. Take Tylenol and wait 3 hours, then take Motrin and wait 3 hours, repeat) for the  first 3-4 days after surgery. If you are without significant pain, medications can be taken more infrequently. It is important to follow dosing instructions on the medication bottle or prescription.   Sample of medication dosing schedule  Give dose of: Time: Given:  Acetaminophen 12 a.m.   Ibuprofen 3 a.m.   Acetaminophen 6 a.m.   Ibuprofen 9 a.m.   Acetaminophen 12 p.m.   Ibuprofen 3 p.m.   Acetaminophen 6 p.m.   Ibuprofen 9 p.m.    If you need to call after clinic hours for a concern, call [phone] and ask for the "physician on call for ENT."  [address] [place], [place] [address] Phone: [phone]

## 2024-06-28 NOTE — Anesthesia Preprocedure Evaluation (Signed)
 Anesthesia Evaluation  Patient identified by MRN, date of birth, ID band Patient awake    Reviewed: Allergy & Precautions, H&P , NPO status , Patient's Chart, lab work & pertinent test results  Airway Mallampati: II   Neck ROM: full    Dental   Pulmonary sleep apnea    breath sounds clear to auscultation       Cardiovascular negative cardio ROS  Rhythm:regular Rate:Normal     Neuro/Psych    GI/Hepatic   Endo/Other    Class 3 obesity  Renal/GU      Musculoskeletal   Abdominal   Peds  Hematology   Anesthesia Other Findings   Reproductive/Obstetrics                              Anesthesia Physical Anesthesia Plan  ASA: 2  Anesthesia Plan: General   Post-op Pain Management:    Induction: Intravenous  PONV Risk Score and Plan: 2 and Ondansetron , Dexamethasone , Midazolam  and Treatment may vary due to age or medical condition  Airway Management Planned: Oral ETT  Additional Equipment:   Intra-op Plan:   Post-operative Plan: Extubation in OR  Informed Consent: I have reviewed the patients History and Physical, chart, labs and discussed the procedure including the risks, benefits and alternatives for the proposed anesthesia with the patient or authorized representative who has indicated his/her understanding and acceptance.     Dental advisory given  Plan Discussed with: CRNA, Anesthesiologist and Surgeon  Anesthesia Plan Comments:         Anesthesia Quick Evaluation

## 2024-06-28 NOTE — Op Note (Signed)
 OPERATIVE NOTE  Spencer Bowman Date/Time of Admission: 06/28/2024 10:05 AM  CSN: [id number];FMW:[id number] Attending Provider: No att. providers found Room/Bed: MCPO/NONE DOB: 12/15/1975 Age: 48 y.o.   Pre-Op Diagnosis: Lipoma of head; Acquired deformity of head  Post-Op Diagnosis: Lipoma of head; Acquired deformity of head  Procedure: Excision tumor soft tissue scalp, sub-fascial 4cm (CPT 21014)  Anesthesia: General  Surgeon(s): Elspeth KANDICE Coddington, MD  Staff: Circulator: Gerome Verla SAILOR, RN Relief Circulator: Bridgett Waddell PARAS, RN Scrub Person: Miriam Shields E  Implants: * No implants in log *  Specimens: ID Type Source Tests Collected by Time Destination  1 : right posterior neck lipoma Tissue PATH Other SURGICAL PATHOLOGY Coddington Elspeth, MD 06/28/2024 1423     Complications: none  EBL: 30 ML  IVF: Per anesthesia ML  Condition: stable  Operative Findings:  4cm posterior neck lipoma removed off posterior neck muscle fascia   Haiku Photo:    Description of Operation: The patient was identified in the preoperative area and consent confirmed in the chart.  He was brought to the operating by anesthesia and a preoperative timeout was performed confirming the patient identity and procedure be performed.  Once all were in agreement we proceeded with surgery.  General anesthesia was induced.  The patient was intubated with an oral tube.  The patient was placed in left lateral decubitus positioning.  The right posterior head was examined and a 5 cm transverse excision in a pre-existing neck crease was designed overlying the lipoma.  10 mL of local anesthetic was infiltrated into the posterior neck.  The patient was prepped and draped in standard sterile fashion for procedure of this kind.  A final preoperative pause was performed and we proceeded with surgery.  15 blade was used to incise the skin and dermis.  Double-pronged skin hooks were applied.  A Bovie was used to  incise the subcutaneous fascia down to the level of the lipoma.  A combination of sharp dissection with Metzenbaum scissors and cautery was used to dissect out the lipoma.  It was densely adherent to the deep posterior muscle fascia at the base of the neck and skull.  Bleeding was controlled bipolar cautery.  A vein was ligated with 3-0 silk ties.  The entire tumor was excised and sent for permanent pathology.  Multiple Valsalva was performed with irrigation.  Hemostasis was achieved.  The wound was closed in a layered fashion using buried interrupted 3-0 Vicryl sutures and running 5-0 nylon.  The wound was dressed with bacitracin .  The patient was then turned back to anesthesia extubated and brought to the recovery room in stable condition.   Elspeth KANDICE Coddington, MD [facility]  06/28/2024

## 2024-06-28 NOTE — Transfer of Care (Signed)
 Immediate Anesthesia Transfer of Care Note  Patient: Spencer Bowman  Procedure(s) Performed: EXCISION, MASS, HEAD (Right)  Patient Location: PACU  Anesthesia Type:General  Level of Consciousness: awake, alert , and oriented  Airway & Oxygen Therapy: Patient Spontanous Breathing and Patient connected to face mask oxygen  Post-op Assessment: Report given to RN and Post -op Vital signs reviewed and stable  Post vital signs: Reviewed and stable  Last Vitals:  Vitals Value Taken Time  BP 162/98 06/28/24 14:47  Temp 37.2 C 06/28/24 14:47  Pulse 87 06/28/24 14:55  Resp 18 06/28/24 14:55  SpO2 97 % 06/28/24 14:55  Vitals shown include unfiled device data.  Last Pain:  Vitals:   06/28/24 1447  TempSrc:   PainSc: 0-No pain         Complications: No notable events documented.

## 2024-06-28 NOTE — H&P (Signed)
 Spencer Bowman is an 48 y.o. male.    Chief Complaint: Posterior neck lipoma  HPI: Patient presents today for planned elective procedure.  He/she denies any interval change in history since office visit on 06/14/24.   Past Medical History:  Diagnosis Date   Sleep apnea    no cpap    Past Surgical History:  Procedure Laterality Date   DENTAL SURGERY     wisdom teeth   NASAL SEPTOPLASTY W/ TURBINOPLASTY Bilateral 01/06/2017   Procedure: NASAL SEPTOPLASTY WITH BILATERAL TURBINATE REDUCTION;  Surgeon: Lonni FORBES Angle, MD;  Location: [facility] OR;  Service: ENT;  Laterality: Bilateral;   UVULOPALATOPHARYNGOPLASTY N/A 01/06/2017   Procedure: UVULOPALATOPHARYNGOPLASTY (UPPP);  Surgeon: Lonni FORBES Angle, MD;  Location: [facility] OR;  Service: ENT;  Laterality: N/A;    Family History  Family history unknown: Yes    Social History:  reports that he has never smoked. He has never used smokeless tobacco. He reports that he does not drink alcohol and does not use drugs.  Allergies: No Known Allergies  Medications Prior to Admission  Medication Sig Dispense Refill   Betaine, Trimethylglycine, (TMG, TRIMETHYLGLYCINE, PO) Take 1 capsule by mouth daily.     cyanocobalamin (VITAMIN B12) 1000 MCG tablet Take 1,000 mcg by mouth daily.     Multiple Vitamins-Minerals (MULTIVITAMIN WITH MINERALS) tablet Take 1 tablet by mouth daily.      Results for orders placed or performed during the hospital encounter of 06/28/24 (from the past 48 hours)  CBC per protocol     Status: None   Collection Time: 06/28/24 10:37 AM  Result Value Ref Range   WBC 7.8 4.0 - 10.5 K/uL   RBC 5.34 4.22 - 5.81 MIL/uL   Hemoglobin 16.7 13.0 - 17.0 g/dL   HCT 51.7 60.9 - 47.9 %   MCV 90.3 80.0 - 100.0 fL   MCH 31.3 26.0 - 34.0 pg   MCHC 34.6 30.0 - 36.0 g/dL   RDW 87.2 88.4 - 84.4 %   Platelets 311 150 - 400 K/uL   nRBC 0.0 0.0 - 0.2 %    Comment: Performed at [facility], [address] [place], [place] [address]    No results found.  ROS: negative other than stated in HPI  Blood pressure (!) 160/95, pulse 87, temperature 98.1 F (36.7 C), temperature source Oral, resp. rate 18, height 5' 9 (1.753 m), weight (!) 138.3 kg, SpO2 97%.  PHYSICAL EXAM: General: Resting comfortably in NAD  Lungs: Non-labored respiratinos  Studies Reviewed: None   Assessment/Plan Posterior neck lipoma  Proceed with excision of deep neck lipoma under GA. Informed consent obtained. RBA discussed.    Electronically signed by:  Elspeth Coddington, MD  Staff Physician Facial Plastic & Reconstructive Surgery Otolaryngology - Head and Neck Surgery [facility]  06/28/2024, 11:57 AM

## 2024-07-01 ENCOUNTER — Encounter (HOSPITAL_COMMUNITY): Payer: Self-pay | Admitting: Otolaryngology

## 2024-07-01 LAB — SURGICAL PATHOLOGY

## 2024-07-11 ENCOUNTER — Telehealth: Payer: Self-pay | Admitting: Neurology

## 2024-07-11 NOTE — Telephone Encounter (Signed)
 The patient left a voicemail about schedule his HST I think it is a Sansa.

## 2024-07-11 NOTE — Telephone Encounter (Signed)
 Returned patient's call. Explained the [facility]a HST program. Pt is scheduled for tomorrow 9/5 at 8a to receive the mailout Sansa HST

## 2024-07-12 ENCOUNTER — Ambulatory Visit: Admitting: Neurology

## 2024-07-12 DIAGNOSIS — G4763 Sleep related bruxism: Secondary | ICD-10-CM

## 2024-07-12 DIAGNOSIS — G4733 Obstructive sleep apnea (adult) (pediatric): Secondary | ICD-10-CM

## 2024-07-12 DIAGNOSIS — G4719 Other hypersomnia: Secondary | ICD-10-CM

## 2024-07-12 DIAGNOSIS — R0681 Apnea, not elsewhere classified: Secondary | ICD-10-CM

## 2024-07-12 DIAGNOSIS — R519 Headache, unspecified: Secondary | ICD-10-CM

## 2024-07-12 DIAGNOSIS — G4734 Idiopathic sleep related nonobstructive alveolar hypoventilation: Secondary | ICD-10-CM

## 2024-07-12 DIAGNOSIS — R635 Abnormal weight gain: Secondary | ICD-10-CM

## 2024-07-12 DIAGNOSIS — R030 Elevated blood-pressure reading, without diagnosis of hypertension: Secondary | ICD-10-CM

## 2024-07-24 ENCOUNTER — Encounter (HOSPITAL_BASED_OUTPATIENT_CLINIC_OR_DEPARTMENT_OTHER): Payer: Self-pay | Admitting: Orthopaedic Surgery

## 2024-07-24 ENCOUNTER — Other Ambulatory Visit: Payer: Self-pay

## 2024-07-24 ENCOUNTER — Ambulatory Visit

## 2024-07-24 NOTE — Progress Notes (Signed)
   07/24/24 1626  Pre-op Phone Call  Surgery Date Verified 07/31/24  Arrival Time Verified 0630  Surgery Location Verified [facility]  Medical History Reviewed Yes  Is the patient taking a GLP-1 receptor agonist? No  Does the patient have diabetes? No diagnosis of diabetes  Do you have a history of heart problems? No  Does patient have other implanted devices? No  Patient Teaching Pre / Post Procedure  Patient educated about smoking cessation 24 hours prior to surgery. N/A Non-Smoker  Patient verbalizes understanding of bowel prep? N/A  Med Rec Completed Yes  Take the Following Meds the Morning of Surgery no meds DOS; hold vitamins/supp/nsaids x5d  Recent  Lab Work, EKG, CXR? No  NPO (Including gum & candy) After midnight  Stop Solids, Milk, Candy, and Gum STARTING AT MIDNIGHT  Responsible adult to drive and be with you for 24 hours? Yes  Name & Phone Number for Ride/Caregiver wife, suzen  No Jewelry, money, nail polish or make-up.  No lotions, powders, perfumes. No shaving  48 hrs. prior to surgery. Yes  Contacts, Dentures & Glasses Will Have to be Removed Before OR. Yes  Please bring your ID and Insurance Card the morning of your surgery. (Surgery Centers Only) Yes  Bring any papers or x-rays with you that your surgeon gave you. Yes  Instructed to contact the location of procedure/ provider if they or anyone in their household develops symptoms or tests positive for COVID-19, has close contact with someone who tests positive for COVID, or has known exposure to any contagious illness. Yes  Call this number the morning of surgery  with any problems that may cancel your surgery. [phone]  Covid-19 Assessment  Have you had a positive COVID-19 test within the previous 90 days? No  COVID Testing Guidance Proceed with the additional questions.  Patient's surgery required a COVID-19 test (cardiothoracic, complex ENT, and bronchoscopies/ EBUS) No  Have you been unmasked and in close contact  with anyone with COVID-19 or COVID-19 symptoms within the past 10 days? No  Do you or anyone in your household currently have any COVID-19 symptoms? No

## 2024-07-28 NOTE — H&P (Signed)
 ORTHOPAEDIC SURGERY H&P  Subjective:  The patient presents for right insertional Achilles repair with calcaneus exostectomy, possible tendon transfer.   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   NASAL SEPTOPLASTY W/ TURBINOPLASTY Bilateral 01/06/2017   Procedure: NASAL SEPTOPLASTY WITH BILATERAL TURBINATE REDUCTION;  Surgeon: Lonni FORBES Angle, MD;  Location: [facility] OR;  Service: ENT;  Laterality: Bilateral;   UVULOPALATOPHARYNGOPLASTY N/A 01/06/2017   Procedure: UVULOPALATOPHARYNGOPLASTY (UPPP);  Surgeon: Lonni FORBES Angle, MD;  Location: [facility] OR;  Service: ENT;  Laterality: N/A;     (Not in an outpatient encounter)    No Known Allergies  Social History   Socioeconomic History   Marital status: Married    Spouse name: Not on file   Number of children: Not on file   Years of education: Not on file   Highest education level: Not on file  Occupational History   Not on file  Tobacco Use   Smoking status: Never   Smokeless tobacco: Never  Substance and Sexual Activity   Alcohol use: No   Drug use: No   Sexual activity: Not on file  Other Topics Concern   Not on file  Social History Narrative   Lives at home with wife, daughter   Right handed   Caffeine: 1-2 cups coffee in the morning   Social Drivers of Health   Financial Resource Strain: Not on file  Food Insecurity: Not on file  Transportation Needs: Not on file  Physical Activity: Not on file  Stress: Not on file  Social Connections: Not on file  Intimate Partner Violence: Not on file     History reviewed. No pertinent family history.   Review of Systems Pertinent items are noted  in HPI.  Objective: Vital signs in last 24 hours:    07/24/2024    4:26 PM 06/28/2024    3:18 PM 06/28/2024    3:15 PM  Vitals with BMI  Height 5' 9    Weight 303 lbs    BMI 44.72    Systolic  152 145  Diastolic  99 111  Pulse  89 86      EXAM: General: Well nourished, well developed. Awake, alert and oriented to time, place, person. Normal mood and affect. No apparent distress. Breathing room air.  Operative Lower Extremity: Alignment - Neutral Deformity - None Skin intact Tenderness to palpation - Achilles insertion 5/5 PT, GS, Per, EHL, FHL Sensation intact to light touch throughout Palpable DP and PT pulses Special testing: None  The contralateral foot/ankle was examined for comparison and noted to be neurovascularly intact with no localized deformity, swelling, or tenderness.  Imaging Review All images taken were independently reviewed by me.  Assessment/Plan: The clinical and radiographic findings were reviewed and discussed at length with the patient.  The patient presents for right insertional Achilles repair with calcaneus exostectomy, possible tendon transfer.  We spoke at length about the natural course of these findings. We discussed nonoperative and operative treatment options in detail.  The risks and benefits were presented and reviewed. The risks due to suture/hardware failure/irritation (or if removing hardware inability to remove  part/all of hardware, recurrent instability), new/persistent/recurrent infection, stiffness, nerve/vessel/tendon injury, nonunion/malunion of any fracture, wound healing issues, allograft usage, development of arthritis, failure of this surgery, possibility of external fixation in certain situations, possibility of delayed definitive surgery, need for further surgery, prolonged wound care including further soft tissue coverage procedures, thromboembolic events, anesthesia/medical complications/events perioperatively and beyond,  amputation, death among others were discussed. The patient acknowledged the explanation and agreed to proceed with the plan.  Spencer Bowman  Orthopaedic Surgery [facility]

## 2024-07-28 NOTE — Discharge Instructions (Signed)
 Lillia Mountain, MD [facility]  Please read the following information regarding your care after surgery.  Medications  You only need a prescription for the narcotic pain medicine (ex. oxycodone , Percocet, Norco).  All of the other medicines listed below are available over the counter. ? acetaminophen  (Tylenol ) 500 mg every 4-6 hours as you need for minor to moderate pain  ? To help prevent blood clots, take aspirin (81 mg) twice daily for at least 42 days after surgery.  You should also get up every hour while you are awake to move around.  Weight Bearing ? Do NOT bear any weight on the operated leg or foot. This means do NOT touch your surgical leg to the ground!  Cast / Splint / Dressing ? If you have a splint, do NOT remove this. Keep your splint, cast or dressing clean and dry.  Don't put anything (coat hanger, pencil, etc) down inside of it.  If it gets wet, call the office immediately to schedule an appointment for a cast change.  Swelling IMPORTANT: It is normal for you to have swelling where you had surgery. To reduce swelling and pain, keep at least 3 pillows under your leg so that your toes are above your nose and your heel is above the level of your hip.  It may be necessary to keep your foot or leg elevated for several weeks.  This is critical to helping your incisions heal and your pain to feel better.  Follow Up Call my office at [phone] when you are discharged from the hospital or surgery center to schedule an appointment to be seen 7-10 days after surgery.  Call my office at [phone] if you develop a fever >101.5 F, nausea, vomiting, bleeding from the surgical site or severe pain.       Post Anesthesia Home Care Instructions  Activity: Get plenty of rest for the remainder of the day. A responsible individual must stay with you for 24 hours following the procedure.  For the next 24 hours, DO NOT: -Drive a car -Advertising copywriter -Drink alcoholic  beverages -Take any medication unless instructed by your physician -Make any legal decisions or sign important papers.  Meals: Start with liquid foods such as gelatin or soup. Progress to regular foods as tolerated. Avoid greasy, spicy, heavy foods. If nausea and/or vomiting occur, drink only clear liquids until the nausea and/or vomiting subsides. Call your physician if vomiting continues.  Special Instructions/Symptoms: Your throat may feel dry or sore from the anesthesia or the breathing tube placed in your throat during surgery. If this causes discomfort, gargle with warm salt water. The discomfort should disappear within 24 hours.  If you had a scopolamine patch placed behind your ear for the management of post- operative nausea and/or vomiting:  1. The medication in the patch is effective for 72 hours, after which it should be removed.  Wrap patch in a tissue and discard in the trash. Wash hands thoroughly with soap and water. 2. You may remove the patch earlier than 72 hours if you experience unpleasant side effects which may include dry mouth, dizziness or visual disturbances. 3. Avoid touching the patch. Wash your hands with soap and water after contact with the patch.     Regional Anesthesia Blocks  1. You may not be able to move or feel the blocked extremity after a regional anesthetic block. This may last may last from 3-48 hours after placement, but it will go away. The length of time depends on  the medication injected and your individual response to the medication. As the nerves start to wake up, you may experience tingling as the movement and feeling returns to your extremity. If the numbness and inability to move your extremity has not gone away after 48 hours, please call your surgeon.   2. The extremity that is blocked will need to be protected until the numbness is gone and the strength has returned. Because you cannot feel it, you will need to take extra care to avoid  injury. Because it may be weak, you may have difficulty moving it or using it. You may not know what position it is in without looking at it while the block is in effect.  3. For blocks in the legs and feet, returning to weight bearing and walking needs to be done carefully. You will need to wait until the numbness is entirely gone and the strength has returned. You should be able to move your leg and foot normally before you try and bear weight or walk. You will need someone to be with you when you first try to ensure you do not fall and possibly risk injury.  4. Bruising and tenderness at the needle site are common side effects and will resolve in a few days.  5. Persistent numbness or new problems with movement should be communicated to the surgeon or the [facility] [phone] [facility] [phone]).   Next dose of Tylenol  may be given at 3:30pm if needed. Next dose of NSAIDs (Ibuprofen /Motrin Jeronimo) may be given 6:45pm if needed.

## 2024-07-31 ENCOUNTER — Encounter (HOSPITAL_BASED_OUTPATIENT_CLINIC_OR_DEPARTMENT_OTHER)
Admission: RE | Disposition: A | Discharge status: Home / Self Care | Payer: Self-pay | Source: Home / Self Care | Attending: Orthopaedic Surgery

## 2024-07-31 ENCOUNTER — Ambulatory Visit (HOSPITAL_BASED_OUTPATIENT_CLINIC_OR_DEPARTMENT_OTHER)
Admission: RE | Admit: 2024-07-31 | Discharge: 2024-07-31 | Disposition: A | Discharge status: Home / Self Care | Payer: Worker's Compensation | Attending: Orthopaedic Surgery | Admitting: Orthopaedic Surgery

## 2024-07-31 ENCOUNTER — Ambulatory Visit (HOSPITAL_BASED_OUTPATIENT_CLINIC_OR_DEPARTMENT_OTHER): Payer: Worker's Compensation | Admitting: Anesthesiology

## 2024-07-31 ENCOUNTER — Other Ambulatory Visit: Payer: Self-pay

## 2024-07-31 ENCOUNTER — Ambulatory Visit (HOSPITAL_COMMUNITY)

## 2024-07-31 ENCOUNTER — Encounter (HOSPITAL_BASED_OUTPATIENT_CLINIC_OR_DEPARTMENT_OTHER): Payer: Self-pay | Admitting: Orthopaedic Surgery

## 2024-07-31 DIAGNOSIS — Z6841 Body Mass Index (BMI) 40.0 and over, adult: Secondary | ICD-10-CM | POA: Diagnosis not present

## 2024-07-31 DIAGNOSIS — S86011A Strain of right Achilles tendon, initial encounter: Secondary | ICD-10-CM | POA: Insufficient documentation

## 2024-07-31 DIAGNOSIS — G4733 Obstructive sleep apnea (adult) (pediatric): Secondary | ICD-10-CM | POA: Diagnosis not present

## 2024-07-31 DIAGNOSIS — G4730 Sleep apnea, unspecified: Secondary | ICD-10-CM | POA: Diagnosis not present

## 2024-07-31 DIAGNOSIS — X58XXXA Exposure to other specified factors, initial encounter: Secondary | ICD-10-CM | POA: Insufficient documentation

## 2024-07-31 DIAGNOSIS — S86012A Strain of left Achilles tendon, initial encounter: Secondary | ICD-10-CM | POA: Diagnosis present

## 2024-07-31 DIAGNOSIS — E66813 Obesity, class 3: Secondary | ICD-10-CM | POA: Diagnosis not present

## 2024-07-31 DIAGNOSIS — R52 Pain, unspecified: Secondary | ICD-10-CM

## 2024-07-31 SURGERY — REPAIR, TENDON, ACHILLES
Anesthesia: General | Laterality: Right

## 2024-07-31 MED ORDER — PHENYLEPHRINE 80 MCG/ML (10ML) SYRINGE FOR IV PUSH (FOR BLOOD PRESSURE SUPPORT)
PREFILLED_SYRINGE | INTRAVENOUS | Status: AC
Start: 2024-07-31 — End: 2024-07-31
  Filled 2024-07-31: qty 10

## 2024-07-31 MED ORDER — CEFAZOLIN SODIUM-DEXTROSE 3-4 GM/150ML-% IV SOLN
3.0000 g | INTRAVENOUS | Status: AC
Start: 2024-07-31 — End: 2024-07-31
  Administered 2024-07-31: 3 g via INTRAVENOUS

## 2024-07-31 MED ORDER — BUPIVACAINE LIPOSOME 1.3 % IJ SUSP
INTRAMUSCULAR | Status: DC | PRN
  Administered 2024-07-31: 10 mL via PERINEURAL

## 2024-07-31 MED ORDER — BUPIVACAINE HCL (PF) 0.5 % IJ SOLN
INTRAMUSCULAR | Status: DC | PRN
  Administered 2024-07-31: 20 mL via PERINEURAL

## 2024-07-31 MED ORDER — SUGAMMADEX SODIUM 200 MG/2ML IV SOLN
INTRAVENOUS | Status: DC | PRN
  Administered 2024-07-31: 400 mg via INTRAVENOUS

## 2024-07-31 MED ORDER — FENTANYL CITRATE (PF) 100 MCG/2ML IJ SOLN
100.0000 ug | Freq: Once | INTRAMUSCULAR | Status: AC
  Administered 2024-07-31: 100 ug via INTRAVENOUS

## 2024-07-31 MED ORDER — ACETAMINOPHEN 10 MG/ML IV SOLN
INTRAVENOUS | Status: DC | PRN
  Administered 2024-07-31: 1000 mg via INTRAVENOUS

## 2024-07-31 MED ORDER — GLYCOPYRROLATE PF 0.2 MG/ML IJ SOSY
PREFILLED_SYRINGE | INTRAMUSCULAR | Status: AC
Start: 2024-07-31 — End: 2024-07-31
  Filled 2024-07-31: qty 1

## 2024-07-31 MED ORDER — DEXAMETHASONE SODIUM PHOSPHATE 10 MG/ML IJ SOLN
INTRAMUSCULAR | Status: AC
  Filled 2024-07-31: qty 1

## 2024-07-31 MED ORDER — GLYCOPYRROLATE PF 0.2 MG/ML IJ SOSY
PREFILLED_SYRINGE | INTRAMUSCULAR | Status: DC | PRN
  Administered 2024-07-31: .2 mg via INTRAVENOUS

## 2024-07-31 MED ORDER — KETOROLAC TROMETHAMINE 30 MG/ML IJ SOLN
INTRAMUSCULAR | Status: DC | PRN
  Administered 2024-07-31: 30 mg via INTRAVENOUS

## 2024-07-31 MED ORDER — ACETAMINOPHEN 10 MG/ML IV SOLN
INTRAVENOUS | Status: AC
  Filled 2024-07-31: qty 100

## 2024-07-31 MED ORDER — HYDROMORPHONE HCL 1 MG/ML IJ SOLN
INTRAMUSCULAR | Status: AC
  Filled 2024-07-31: qty 0.5

## 2024-07-31 MED ORDER — CEFAZOLIN SODIUM-DEXTROSE 3-4 GM/150ML-% IV SOLN
INTRAVENOUS | Status: AC
  Filled 2024-07-31: qty 150

## 2024-07-31 MED ORDER — ROCURONIUM 10MG/ML (10ML) SYRINGE FOR MEDFUSION PUMP - OPTIME
INTRAVENOUS | Status: DC | PRN
  Administered 2024-07-31: 40 mg via INTRAVENOUS
  Administered 2024-07-31: 10 mg via INTRAVENOUS
  Administered 2024-07-31: 20 mg via INTRAVENOUS

## 2024-07-31 MED ORDER — ONDANSETRON HCL 4 MG/2ML IJ SOLN
INTRAMUSCULAR | Status: DC | PRN
  Administered 2024-07-31: 4 mg via INTRAVENOUS

## 2024-07-31 MED ORDER — MEPERIDINE HCL 25 MG/ML IJ SOLN: 6.2500 mg | INTRAMUSCULAR | Status: DC | PRN

## 2024-07-31 MED ORDER — MIDAZOLAM HCL 2 MG/2ML IJ SOLN
INTRAMUSCULAR | Status: AC
  Filled 2024-07-31: qty 2

## 2024-07-31 MED ORDER — HYDROMORPHONE HCL 1 MG/ML IJ SOLN
INTRAMUSCULAR | Status: DC | PRN
  Administered 2024-07-31: .5 mg via INTRAVENOUS

## 2024-07-31 MED ORDER — CEFAZOLIN SODIUM-DEXTROSE 2-4 GM/100ML-% IV SOLN
INTRAVENOUS | Status: AC
  Filled 2024-07-31: qty 100

## 2024-07-31 MED ORDER — VANCOMYCIN HCL 500 MG IV SOLR
INTRAVENOUS | Status: DC | PRN
  Administered 2024-07-31: 500 mg via TOPICAL

## 2024-07-31 MED ORDER — 0.9 % SODIUM CHLORIDE (POUR BTL) OPTIME
TOPICAL | Status: DC | PRN
  Administered 2024-07-31: 500 mL

## 2024-07-31 MED ORDER — OXYCODONE HCL 5 MG/5ML PO SOLN: 5.0000 mg | Freq: Once | ORAL | Status: DC | PRN

## 2024-07-31 MED ORDER — FENTANYL CITRATE (PF) 100 MCG/2ML IJ SOLN
INTRAMUSCULAR | Status: AC
  Filled 2024-07-31: qty 2

## 2024-07-31 MED ORDER — AMISULPRIDE (ANTIEMETIC) 5 MG/2ML IV SOLN: 10.0000 mg | Freq: Once | INTRAVENOUS | Status: DC | PRN

## 2024-07-31 MED ORDER — SUCCINYLCHOLINE CHLORIDE 200 MG/10ML IV SOSY
PREFILLED_SYRINGE | INTRAVENOUS | Status: DC | PRN
  Administered 2024-07-31: 120 mg via INTRAVENOUS

## 2024-07-31 MED ORDER — LIDOCAINE 2% (20 MG/ML) 5 ML SYRINGE
INTRAMUSCULAR | Status: AC
  Filled 2024-07-31: qty 5

## 2024-07-31 MED ORDER — EPHEDRINE SULFATE-NACL 50-0.9 MG/10ML-% IV SOSY
PREFILLED_SYRINGE | INTRAVENOUS | Status: DC | PRN
  Administered 2024-07-31: 5 mg via INTRAVENOUS

## 2024-07-31 MED ORDER — LACTATED RINGERS IV SOLN: INTRAVENOUS | Status: DC

## 2024-07-31 MED ORDER — LIDOCAINE 2% (20 MG/ML) 5 ML SYRINGE
INTRAMUSCULAR | Status: DC | PRN
  Administered 2024-07-31: 40 mg via INTRAVENOUS

## 2024-07-31 MED ORDER — PHENYLEPHRINE 80 MCG/ML (10ML) SYRINGE FOR IV PUSH (FOR BLOOD PRESSURE SUPPORT)
PREFILLED_SYRINGE | INTRAVENOUS | Status: DC | PRN
  Administered 2024-07-31: 160 ug via INTRAVENOUS

## 2024-07-31 MED ORDER — ROCURONIUM BROMIDE 10 MG/ML (PF) SYRINGE
PREFILLED_SYRINGE | INTRAVENOUS | Status: AC
Start: 2024-07-31 — End: 2024-07-31
  Filled 2024-07-31: qty 10

## 2024-07-31 MED ORDER — HYDROMORPHONE HCL 1 MG/ML IJ SOLN: 0.2500 mg | INTRAMUSCULAR | Status: DC | PRN

## 2024-07-31 MED ORDER — KETOROLAC TROMETHAMINE 30 MG/ML IJ SOLN
INTRAMUSCULAR | Status: AC
Start: 2024-07-31 — End: 2024-07-31
  Filled 2024-07-31: qty 1

## 2024-07-31 MED ORDER — MIDAZOLAM HCL 2 MG/2ML IJ SOLN
2.0000 mg | Freq: Once | INTRAMUSCULAR | Status: AC
  Administered 2024-07-31: 2 mg via INTRAVENOUS

## 2024-07-31 MED ORDER — SUCCINYLCHOLINE CHLORIDE 200 MG/10ML IV SOSY
PREFILLED_SYRINGE | INTRAVENOUS | Status: AC
Start: 2024-07-31 — End: 2024-07-31
  Filled 2024-07-31: qty 10

## 2024-07-31 MED ORDER — DEXAMETHASONE SODIUM PHOSPHATE 10 MG/ML IJ SOLN
INTRAMUSCULAR | Status: DC | PRN
  Administered 2024-07-31: 10 mg via INTRAVENOUS

## 2024-07-31 MED ORDER — FENTANYL CITRATE (PF) 100 MCG/2ML IJ SOLN
INTRAMUSCULAR | Status: DC | PRN
  Administered 2024-07-31 (×2): 50 ug via INTRAVENOUS

## 2024-07-31 MED ORDER — ONDANSETRON HCL 4 MG/2ML IJ SOLN
INTRAMUSCULAR | Status: AC
  Filled 2024-07-31: qty 2

## 2024-07-31 MED ORDER — EPHEDRINE 5 MG/ML INJ
INTRAVENOUS | Status: AC
Start: 2024-07-31 — End: 2024-07-31
  Filled 2024-07-31: qty 5

## 2024-07-31 MED ORDER — OXYCODONE HCL 5 MG PO TABS: 5.0000 mg | ORAL_TABLET | Freq: Once | ORAL | Status: DC | PRN

## 2024-07-31 MED ORDER — DEXMEDETOMIDINE HCL IN NACL 80 MCG/20ML IV SOLN
INTRAVENOUS | Status: DC | PRN
  Administered 2024-07-31: 8 ug via INTRAVENOUS
  Administered 2024-07-31: 4 ug via INTRAVENOUS
  Administered 2024-07-31: 12 ug via INTRAVENOUS

## 2024-07-31 MED ORDER — PROPOFOL 10 MG/ML IV BOLUS
INTRAVENOUS | Status: DC | PRN
Start: 2024-07-31 — End: 2024-07-31
  Administered 2024-07-31: 200 ug via INTRAVENOUS

## 2024-07-31 MED ORDER — CHLORHEXIDINE GLUCONATE 4 % EX SOLN: 60.0000 mL | Freq: Once | CUTANEOUS | Status: DC

## 2024-07-31 MED ORDER — SODIUM CHLORIDE 0.9 % IV SOLN
12.5000 mg | INTRAVENOUS | Status: DC | PRN
  Filled 2024-07-31: qty 0.5

## 2024-07-31 SURGICAL SUPPLY — 55 items
ANCHOR KNTLS VENTIX 4.75 (Anchor) IMPLANT
BIT DRILL 4.75 VENTX LNK ANCHR (DRILL) IMPLANT
BIT DRILL JUGRKNT W/NDL BIT2.9 (DRILL) IMPLANT
BLADE AVERAGE 25X9 (BLADE) IMPLANT
BLADE MICRO SAGITTAL (BLADE) IMPLANT
BLADE SAW SGTL 14.8X61X.97 HD (BLADE) IMPLANT
BLADE SURG 15 STRL LF DISP TIS (BLADE) ×2 IMPLANT
BNDG COMPR ESMARK 6X3 LF (GAUZE/BANDAGES/DRESSINGS) IMPLANT
BNDG ELASTIC 6X10 VLCR STRL LF (GAUZE/BANDAGES/DRESSINGS) ×1 IMPLANT
BNDG GAUZE DERMACEA FLUFF 4 (GAUZE/BANDAGES/DRESSINGS) ×1 IMPLANT
CANISTER SUCT 1200ML W/VALVE (MISCELLANEOUS) ×1 IMPLANT
CHLORAPREP W/TINT 26 (MISCELLANEOUS) ×1 IMPLANT
COVER BACK TABLE 60X90IN (DRAPES) ×1 IMPLANT
CUFF TRNQT CYL 34X4.125X (TOURNIQUET CUFF) ×1 IMPLANT
DRAPE C-ARMOR (DRAPES) IMPLANT
DRAPE EXTREMITY T 121X128X90 (DISPOSABLE) ×1 IMPLANT
DRAPE IMP U-DRAPE 54X76 (DRAPES) ×1 IMPLANT
DRAPE OEC MINIVIEW 54X84 (DRAPES) IMPLANT
DRAPE U-SHAPE 47X51 STRL (DRAPES) ×1 IMPLANT
DRSG MEPITEL 4X7.2 (GAUZE/BANDAGES/DRESSINGS) ×1 IMPLANT
ELECTRODE REM PT RTRN 9FT ADLT (ELECTROSURGICAL) ×1 IMPLANT
GAUZE PAD ABD 8X10 STRL (GAUZE/BANDAGES/DRESSINGS) ×5 IMPLANT
GAUZE SPONGE 4X4 12PLY STRL (GAUZE/BANDAGES/DRESSINGS) ×1 IMPLANT
GLOVE BIOGEL PI IND STRL 8 (GLOVE) ×1 IMPLANT
GLOVE SURG SS PI 7.5 STRL IVOR (GLOVE) ×2 IMPLANT
GOWN STRL REUS W/ TWL LRG LVL3 (GOWN DISPOSABLE) ×2 IMPLANT
Juggarknot IMPLANT
NDL HYPO 25X1 1.5 SAFETY (NEEDLE) IMPLANT
NDL SUT 6 .5 CRC .975X.05 MAYO (NEEDLE) IMPLANT
NEEDLE HYPO 25X1 1.5 SAFETY (NEEDLE) IMPLANT
PACK BASIN DAY SURGERY FS (CUSTOM PROCEDURE TRAY) ×1 IMPLANT
PADDING CAST ABS COTTON 4X4 ST (CAST SUPPLIES) IMPLANT
PADDING CAST SYNTHETIC 4X4 STR (CAST SUPPLIES) ×3 IMPLANT
PADDING CAST SYNTHETIC 6X4 NS (CAST SUPPLIES) ×3 IMPLANT
PENCIL SMOKE EVACUATOR (MISCELLANEOUS) ×1 IMPLANT
SANITIZER HAND ALTRA PUMP 550 (MISCELLANEOUS) ×1 IMPLANT
SHEET MEDIUM DRAPE 40X70 STRL (DRAPES) ×1 IMPLANT
SLEEVE SCD COMPRESS KNEE MED (STOCKING) ×1 IMPLANT
SPLINT FIBERGLASS 4X30 (CAST SUPPLIES) ×1 IMPLANT
SPONGE T-LAP 18X18 ~~LOC~~+RFID (SPONGE) ×1 IMPLANT
SUCTION TUBE FRAZIER 10FR DISP (SUCTIONS) IMPLANT
SUT ETHILON 2 0 FS 18 (SUTURE) IMPLANT
SUT MNCRL AB 3-0 PS2 18 (SUTURE) IMPLANT
SUT VIC AB 0 CT1 27XBRD ANBCTR (SUTURE) ×1 IMPLANT
SUT VIC AB 1 CT1 27XBRD ANBCTR (SUTURE) IMPLANT
SUT VIC AB 2-0 CT1 TAPERPNT 27 (SUTURE) IMPLANT
SUT VIC AB 3-0 SH 27X BRD (SUTURE) ×1 IMPLANT
SUTURE FIBERWR #2 38 T-5 BLUE (SUTURE) IMPLANT
SUTURE TAPE 1.3 FIBERLOP 20 ST (SUTURE) IMPLANT
SYR BULB EAR ULCER 3OZ GRN STR (SYRINGE) ×1 IMPLANT
SYR CONTROL 10ML LL (SYRINGE) IMPLANT
TOWEL GREEN STERILE FF (TOWEL DISPOSABLE) ×2 IMPLANT
TUBE CONNECTING 20X1/4 (TUBING) IMPLANT
UNDERPAD 30X36 HEAVY ABSORB (UNDERPADS AND DIAPERS) ×1 IMPLANT
YANKAUER SUCT BULB TIP NO VENT (SUCTIONS) IMPLANT

## 2024-07-31 NOTE — Transfer of Care (Signed)
 Immediate Anesthesia Transfer of Care Note  Patient: Spencer Bowman  Procedure(s) Performed: REPAIR, TENDON, ACHILLES (Right)  Patient Location: PACU  Anesthesia Type:GA combined with regional for post-op pain  Level of Consciousness: awake, alert , oriented, and pateint uncooperative  Airway & Oxygen Therapy: Patient Spontanous Breathing and Patient connected to face mask oxygen  Post-op Assessment: Report given to RN and Post -op Vital signs reviewed and stable  Post vital signs: Reviewed and stable  Last Vitals:  Vitals Value Taken Time  BP 121/87 07/31/24 11:15  Temp 36.4 C 07/31/24 11:10  Pulse 96 07/31/24 11:17  Resp 22 07/31/24 11:17  SpO2 96 % 07/31/24 11:17  Vitals shown include unfiled device data.  Last Pain:  Vitals:   07/31/24 1110  TempSrc:   PainSc: 0-No pain      Patients Stated Pain Goal: 4 (07/31/24 9350)  Complications: No notable events documented.

## 2024-07-31 NOTE — Op Note (Signed)
 07/31/2024  11:36 AM   PATIENT: Spencer Bowman  48 y.o. male  MRN: [id number]   PRE-OPERATIVE DIAGNOSIS:   Insertional rupture of right Achilles tendon, initial encounter   POST-OPERATIVE DIAGNOSIS:   Same   PROCEDURE: 1] Secondary repair of right Achilles insertional rupture 2] Right leg posterior compartment fasciotomy 3] Right calcaneus exostectomy with Haglund excision   SURGEON:  Lillia Mountain, MD   ASSISTANT: None   ANESTHESIA: General, regional   EBL: Minimal   TOURNIQUET:   87 min   COMPLICATIONS: None apparent   DISPOSITION: Extubated, awake and stable to recovery.   INDICATION FOR PROCEDURE: The patient presented with above diagnosis.  We discussed the diagnosis, alternative treatment options, risks and benefits of the above surgical intervention, as well as alternative non-operative treatments. All questions/concerns were addressed and the patient/family demonstrated appropriate understanding of the diagnosis, the procedure, the postoperative course, and overall prognosis. The patient wished to proceed with surgical intervention and signed an informed surgical consent as such, in each others presence prior to surgery.   PROCEDURE IN DETAIL: After preoperative consent was obtained and the correct operative site was identified, the patient was brought to the operating room supine on stretcher and transferred prone onto operating table. General anesthesia was induced. Preoperative antibiotics were administered. Surgical timeout was taken. The patient was then positioned with an ipsilateral hip bump. The operative lower extremity was prepped and draped in standard sterile fashion with a tourniquet around the thigh. The extremity was exsanguinated and the tourniquet was inflated to 275 mmHg.  PROCEDURE IN DETAIL: After preoperative consent was obtained and the correct operative site was identified, the patient was brought to the operating room supine  on stretcher and transferred onto operating table. General anesthesia was induced. Preoperative antibiotics were administered. Surgical timeout was taken. The patient was then positioned prone with a contralateral hip bump. The operative lower extremity was prepped and draped in standard sterile fashion with a tourniquet around the thigh. The extremity was exsanguinated and the tourniquet was inflated to 275 mmHg.   Attention was turned to the posterior heel where a longitudinal incision was made. Dissection was carried down through the subcutaneous tissues and paratenon. The Achilles was then split longitudinally and released from its insertion medially and laterally. The tendon was debrided of all degenerated fibers and calcifications. An oscillating saw was used to resect the insertional enthesophyte and prominent Haglund deformity. The tendon was then repaired to the cut surface of bone using two Zimmer juggerknot anchors and distal Ventix anchor with an hourglass pattern of suture. The ankle was dorsiflexed to neutral and there was confirmed to be no gapping at repair site. The tendon was further repaired with 1 Vicryl along logitudinal split.  A posterior compartment fasciotomy was performed by incising the sheath of the flexor hallucis longus tendon.   The surgical sites were thoroughly irrigated. The tourniquet was deflated and hemostasis achieved. Betadine and vancomycin  powder were applied. The paratenon and deep layers were closed using 2-0 vicryl. The skin was closed without tension.    The leg was cleaned with saline and sterile dressings with gauze were applied. A well padded bulky short leg splint was applied in resting plantarflexion. The patient was awakened from anesthesia and transported to the recovery room in stable condition.      FOLLOW UP PLAN: -transfer to PACU, then home -strict NWB operative extremity, maximum elevation -maintain short leg splint until follow up -DVT ppx:  Aspirin 81 mg twice daily  while NWB -follow up as outpatient within 7-10 days for wound check with exchange of short leg splint to short leg cast -sutures out in 2-3 weeks in outpatient office     RADIOGRAPHS: AP, lateral, oblique radiographs of the right ankle were obtained intraoperatively. These showed interval excision of the calcaneal Haglund prominence and insertional spurring. No other acute injuries are noted.     Lillia Mountain [facility]

## 2024-07-31 NOTE — Anesthesia Preprocedure Evaluation (Addendum)
 Anesthesia Evaluation  Patient identified by MRN, date of birth, ID band Patient awake    Reviewed: Allergy & Precautions, H&P , NPO status , Patient's Chart, lab work & pertinent test results  Airway Mallampati: II   Neck ROM: full    Dental  (+) Dental Advisory Given   Pulmonary sleep apnea    breath sounds clear to auscultation       Cardiovascular negative cardio ROS  Rhythm:regular Rate:Normal     Neuro/Psych    GI/Hepatic   Endo/Other    Class 3 obesity  Renal/GU      Musculoskeletal   Abdominal  (+) + obese  Peds  Hematology   Anesthesia Other Findings   Reproductive/Obstetrics                              Anesthesia Physical Anesthesia Plan  ASA: 3  Anesthesia Plan: General   Post-op Pain Management: Regional block*   Induction: Intravenous  PONV Risk Score and Plan: 2 and Ondansetron , Midazolam  and Treatment may vary due to age or medical condition  Airway Management Planned: Oral ETT  Additional Equipment:   Intra-op Plan:   Post-operative Plan: Extubation in OR  Informed Consent: I have reviewed the patients History and Physical, chart, labs and discussed the procedure including the risks, benefits and alternatives for the proposed anesthesia with the patient or authorized representative who has indicated his/her understanding and acceptance.     Dental advisory given  Plan Discussed with: CRNA, Anesthesiologist and Surgeon  Anesthesia Plan Comments:          Anesthesia Quick Evaluation

## 2024-07-31 NOTE — Anesthesia Postprocedure Evaluation (Signed)
 Anesthesia Post Note  Patient: Spencer Bowman  Procedure(s) Performed: REPAIR, TENDON, ACHILLES (Right)     Patient location during evaluation: PACU Anesthesia Type: General Level of consciousness: awake and alert Pain management: pain level controlled Vital Signs Assessment: post-procedure vital signs reviewed and stable Respiratory status: spontaneous breathing, nonlabored ventilation and respiratory function stable Cardiovascular status: blood pressure returned to baseline and stable Postop Assessment: no apparent nausea or vomiting Anesthetic complications: no   No notable events documented.  Last Vitals:  Vitals:   07/31/24 1115 07/31/24 1129  BP: 121/87 (!) 135/98  Pulse: 97 94  Resp: 20   Temp:  (!) 36.4 C  SpO2: 94% 95%    Last Pain:  Vitals:   07/31/24 1129  TempSrc: Temporal  PainSc: 0-No pain                 Butler Levander Pinal

## 2024-07-31 NOTE — Anesthesia Procedure Notes (Signed)
 Procedure Name: Intubation Date/Time: 07/31/2024 8:56 AM  Performed by: Denton Niels CROME, CRNAPre-anesthesia Checklist: Patient identified, Emergency Drugs available, Suction available and Patient being monitored Patient Re-evaluated:Patient Re-evaluated prior to induction Oxygen Delivery Method: Circle system utilized Preoxygenation: Pre-oxygenation with 100% oxygen Induction Type: IV induction Ventilation: Mask ventilation without difficulty Laryngoscope Size: Mac and 3 Grade View: Grade II Tube type: Oral Tube size: 7.5 mm Number of attempts: 2 Airway Equipment and Method: Stylet Placement Confirmation: ETT inserted through vocal cords under direct vision, positive ETCO2 and breath sounds checked- equal and bilateral Secured at: 23 cm Tube secured with: Tape Dental Injury: Teeth and Oropharynx as per pre-operative assessment

## 2024-07-31 NOTE — H&P (Signed)

## 2024-07-31 NOTE — Anesthesia Procedure Notes (Signed)
 Anesthesia Regional Block: Popliteal block   Pre-Anesthetic Checklist: , timeout performed,  Correct Patient, Correct Site, Correct Laterality,  Correct Procedure, Correct Position, site marked,  Risks and benefits discussed,  Surgical consent,  Pre-op evaluation,  At surgeon's request and post-op pain management  Laterality: Right  Prep: chloraprep       Needles:  Injection technique: Single-shot  Needle Type: Stimiplex     Needle Length: 9cm  Needle Gauge: 21     Additional Needles:   Procedures:,,,, ultrasound used (permanent image in chart),,    Narrative:  Start time: 07/31/2024 8:11 AM End time: 07/31/2024 8:16 AM Injection made incrementally with aspirations every 5 mL.  Performed by: Personally  Anesthesiologist: Cleotilde Butler Dade, MD

## 2024-08-01 ENCOUNTER — Encounter (HOSPITAL_BASED_OUTPATIENT_CLINIC_OR_DEPARTMENT_OTHER): Payer: Self-pay | Admitting: Orthopaedic Surgery

## 2024-08-14 NOTE — Progress Notes (Unsigned)
 SABRA

## 2024-08-16 ENCOUNTER — Ambulatory Visit: Payer: Self-pay | Admitting: Neurology

## 2024-08-16 DIAGNOSIS — G4733 Obstructive sleep apnea (adult) (pediatric): Secondary | ICD-10-CM

## 2024-08-16 DIAGNOSIS — G4734 Idiopathic sleep related nonobstructive alveolar hypoventilation: Secondary | ICD-10-CM

## 2024-08-16 NOTE — Procedures (Signed)
 [facility]  HOME SLEEP TEST (SANSA) REPORT (Mail-Out Device):   STUDY DATE: 08/05/24  DOB: Feb 07, 1976  MRN: [id number]  ORDERING CLINICIAN: True Mar, MD, PhD   REFERRING CLINICIAN: Melvenia Manus BRAVO, MD   CLINICAL INFORMATION/HISTORY (obtained from visit note dated 06/06/2024): 48 year old male with an underlying medical history of hypertension, right knee pain, right ankle pain, and severe obesity with a BMI of over 40, who reports snoring and excessive daytime somnolence.   PATIENT'S LAST REPORTED EPWORTH SLEEPINESS SCORE (ESS): 16/24.  BMI (at the time of sleep clinic visit and/or test date): 44.1 kg/m  FINDINGS:   Study Protocol:    The SANSA single-point-of-skin-contact chest-worn sensor - an FDA cleared and DOT approved type 4 home sleep test device - measures eight physiological channels,  including blood oxygen saturation (measured via PPG [photoplethysmography]), EKG-derived heart rate, respiratory effort, chest movement (measured via accelerometer), snoring, body position, and actigraphy. The device is designed to be worn for up to 10 hours per study.   Sleep Summary:   Total Recording Time (hours, min): 7 hours, 34 min  Total Effective Sleep Time (hours, min):  3 hours, 55 min  Sleep Efficiency (%):    52%   Respiratory Indices:   Calculated sAHI (per hour):  78.5/hour         Oxygen Saturation Statistics:    Oxygen Saturation (%) Mean: 87.6%   Minimum oxygen saturation (%):                 50%   O2 Saturation Range (%): 50-100%   Time below or at 88% saturation: 120 min   Pulse Rate Statistics:   Pulse Mean (bpm):    84/min    Pulse Range (31-145/min)   Snoring: Moderate  IMPRESSION/DIAGNOSES:   OSA (obstructive sleep apnea), severe Nocturnal Hypoxemia  RECOMMENDATIONS:   This home sleep test demonstrates severe obstructive sleep apnea with a total AHI of 78.5/hour and O2 nadir of 50% with significant time below or at  88% saturation of 120 minutes for the study, indicating nocturnal hypoxemia. Snoring was detected, in the moderate range range.  This Home sleep test was somewhat limited secondary to total sleep time of less than 4 hours, nevertheless, this patient has clear evidence of severe sleep disordered breathing and urgent treatment is recommended.  His average oxygen saturation was less than 88%.  Treatment with positive airway pressure is highly recommended. The patient will be advised to proceed with an autoPAP titration/trial at home. A laboratory attended titration study can be considered in the future for optimization of treatment settings and to improve tolerance and compliance, if needed, down the road.  Alternative treatment options are limited secondary to the severity of the patient's sleep disordered breathing, but may include surgical treatment with an implantable hypoglossal nerve stimulator (in carefully selected candidates, meeting criteria).  Concomitant weight loss is highly recommended (where clinically appropriate).  Please note, that untreated obstructive sleep apnea may carry additional perioperative morbidity. Patients with significant obstructive sleep apnea should receive perioperative PAP therapy and the surgeons and particularly the anesthesiologist should be informed of the diagnosis and the severity of the sleep disordered breathing. The patient should be cautioned not to drive, work at heights, or operate dangerous or heavy equipment when tired or sleepy. Review and reiteration of good sleep hygiene measures should be pursued with any patient. Other causes of the patient's symptoms, including circadian rhythm disturbances, an underlying mood disorder, medication effect and/or an underlying medical problem  cannot be ruled out based on this test. Clinical correlation is recommended.  The patient and his referring provider will be notified of the test results. The patient will be seen in  follow up in sleep clinic at [facility].  I certify that I have reviewed the raw data recording prior to the issuance of this report in accordance with the standards of the American Academy of Sleep Medicine (AASM).    INTERPRETING PHYSICIAN:   True Mar, MD, PhD Medical Director, [facility]) Diplomat, ABPN (Neurology and Sleep)   [facility] [address], [address] [place], [place] [address] [phone]

## 2024-08-19 NOTE — Telephone Encounter (Signed)
 Spoke to patient gave sleep study results. Pt aware urgent set up due to severe sleep apnea Pt chose [facility] as DME . Gave [facility] # to patient Gave pt f/u appointment with [facility] 11/2024 sent orders to [facility] and forward results of sleep study to PCP Pt expressed understanding and thanked me for calling

## 2024-08-19 NOTE — Telephone Encounter (Signed)
-----   Message from True Mar sent at 08/16/2024 10:53 AM EDT ----- Urgent set up requested on PAP therapy, due to severe OSA.   Patient referred by PCP, seen by me on 06/06/2024, patient had a HST on 08/05/2024.    Please call and notify the patient that the recent home sleep test showed obstructive sleep apnea in the severe range. I recommend treatment for this in the form of autoPAP, which means, that we  don't have to bring him in for a sleep study with CPAP, but will let him start using a so called autoPAP machine at home, through a DME company (of his choice, or as per insurance requirement). The  DME representative will fit the patient with a mask of choice, educate him on how to use the machine, how to put the mask on, etc. I have placed an order in the chart. Please send the order to a  local DME, talk to patient, send report to referring MD. Please also reinforce the need for compliance with treatment. We will need a FU in sleep clinic for 10 weeks post-PAP set up, please arrange  that with me or one of our NPs.   Please also advise patient that his average oxygen saturation was below 90% throughout the night, we call this nocturnal hypoxemia.  I would recommend he discuss with his PCP further workup  potentially for underlying lung related causes and also continue to work aggressively on weight loss which may improve his oxygen saturations at night, eventually improve his blood pressure numbers,  improved joint pain and also decrease the severity of his sleep apnea.  Thanks,   True Mar, MD, PhD [facility])    ----- Message ----- From: Mar True, MD Sent: 08/16/2024  10:49 AM EDT To: True Mar, MD

## 2024-08-22 ENCOUNTER — Telehealth: Payer: Self-pay

## 2024-08-22 NOTE — Telephone Encounter (Signed)
 Copied from CRM #[id number]. Topic: Clinical - Medical Advice >> Aug 22, 2024  9:39 AM Willma R wrote: Reason for CRM: Patient had a sleep study done about a week ago and they stated he stopped breathing 78 times in an hour and was considered high risk of brain damage and death. Someone was supposed to follow up and give instructions in regards to getting a Cpap but he hasn't heard anything and is unsure about his next steps.   Patient can be reached at [phone]

## 2024-09-04 NOTE — Telephone Encounter (Signed)
 Pt called to ask for name and # to his DME, information provided.

## 2024-11-11 ENCOUNTER — Telehealth: Payer: Self-pay | Admitting: Adult Health

## 2024-11-11 NOTE — Telephone Encounter (Signed)
 Pt had to r/s tomorrow appt, because he just had surgery. He has some questions regarding using the CPAP, would like someone to call to go over some infor. please

## 2024-11-11 NOTE — Telephone Encounter (Signed)
 Spoke to patient had questions about changing filter on cpap machine Informed patient to please call DME for Maintenance on pap machine Pt hung up .

## 2024-11-12 ENCOUNTER — Encounter: Admitting: Adult Health

## 2024-11-20 ENCOUNTER — Ambulatory Visit

## 2024-12-04 ENCOUNTER — Ambulatory Visit: Payer: Self-pay

## 2024-12-04 ENCOUNTER — Telehealth: Payer: Self-pay

## 2024-12-04 ENCOUNTER — Encounter: Payer: Self-pay | Admitting: Family Medicine

## 2024-12-04 ENCOUNTER — Ambulatory Visit (INDEPENDENT_AMBULATORY_CARE_PROVIDER_SITE_OTHER): Admitting: Family Medicine

## 2024-12-04 ENCOUNTER — Ambulatory Visit (HOSPITAL_COMMUNITY)
Admission: RE | Admit: 2024-12-04 | Discharge: 2024-12-04 | Disposition: A | Discharge status: Home / Self Care | Source: Ambulatory Visit | Attending: Family Medicine | Admitting: Family Medicine

## 2024-12-04 VITALS — BP 143/96 | HR 88 | Temp 98.2°F | Wt 310.8 lb

## 2024-12-04 DIAGNOSIS — M25511 Pain in right shoulder: Secondary | ICD-10-CM | POA: Diagnosis present

## 2024-12-04 DIAGNOSIS — M545 Low back pain, unspecified: Secondary | ICD-10-CM | POA: Insufficient documentation

## 2024-12-04 MED ORDER — TIZANIDINE HCL 4 MG PO TABS: 4.0000 mg | ORAL_TABLET | Freq: Three times a day (TID) | ORAL | 0 refills | Status: AC | PRN

## 2024-12-04 MED ORDER — DICLOFENAC SODIUM 75 MG PO TBEC: 75.0000 mg | DELAYED_RELEASE_TABLET | Freq: Two times a day (BID) | ORAL | 0 refills | Status: AC

## 2024-12-04 NOTE — Patient Instructions (Signed)
 Xrays at the hospital.  Medication as directed.  Rest. Heat.  Stop daily aspirin.   We will be in touch with the results of the xrays.

## 2024-12-04 NOTE — Assessment & Plan Note (Signed)
 X-ray to rule out fracture.  Treating with diclofenac  and Zanaflex .

## 2024-12-04 NOTE — Assessment & Plan Note (Signed)
X-ray for further evaluation.

## 2024-12-04 NOTE — Telephone Encounter (Signed)
 FYI Only or Action Required?: FYI only for provider: appointment scheduled on 12/04/24.  Patient was last seen in primary care on 03/15/2024 by Melvenia Manus BRAVO, MD.  Called Nurse Triage reporting Back Injury and Fall.  Symptoms began today.  Interventions attempted: Nothing.  Symptoms are: gradually worsening.  Triage Disposition: See HCP Within 4 Hours (Or PCP Triage)  Patient/caregiver understands and will follow disposition?: Yes                Message from Harlene ORN sent at 12/04/2024  8:02 AM EST  Reason for Triage: slipped on ice and hit his back and is in sharp pain   Reason for Disposition  Pain radiates into the thigh or further down the leg now  Answer Assessment - Initial Assessment Questions 1. MECHANISM: How did the injury happen? Note: Consider the possibility of domestic violence or elder abuse.     Slip and fall on ice this morning, landed on back and hit his right shoulder on the car port. Recent surgery on Achilles tendon and history of torn meniscus on right side. He was trying to be careful/mindful of his right leg.   2. ONSET: When did the injury happen? (e.g., minutes or hours ago)     This morning.  3. LOCATION: What part of the back is injured?     Lower mid back. Radiates down both legs.  4. SEVERITY: Can you move the back normally?     Stiffness, able to sit and stand. Pain worse when standing.  5. PAIN: Is there any pain? If Yes, ask: How bad is the pain? (Scale 0-10; or none, mild, moderate, severe)     10/10  6. SIZE: For cuts, bruises, or swelling, ask: How large is it? (e.g., inches or centimeters)     No.  7. TETANUS: For any breaks in the skin, ask: When was your last tetanus booster?     N/A.  8. NEUROLOGIC SYMPTOMS: Any weakness or numbness of the arms or legs?     No.  9. OTHER SYMPTOMS: Do you have any other symptoms? (e.g., abdomen pain, blood in urine)     No numbness, weakness, open wounds or  bleeding, urinary symptoms, head injury.  Protocols used: Back Injury-A-AH

## 2024-12-04 NOTE — Progress Notes (Signed)
 "  Subjective:  Patient ID: Spencer Bowman, male    DOB: 1976/10/26  Age: 49 y.o. MRN: [id number]  CC:  Fall, injury   HPI:  49 year old male presents for evaluation of the above.  Patient reports that he slipped and fell backwards this morning on ice.  He states that he is experiencing bilateral low back pain.  No red flag symptoms (saddle anesthesia or incontinence).  Patient reports that when he tried to get himself up he worsened his chronic right shoulder pain.  He has taken 1 dose of ibuprofen  without significant improvement.     Patient Active Problem List   Diagnosis Date Noted   Acute bilateral low back pain 12/04/2024   Right shoulder pain 12/04/2024   Prediabetes 04/10/2024   Hyperlipidemia 04/10/2024   Morbid obesity (HCC) 02/13/2024   Elevated blood pressure reading 02/13/2024   Obstructive sleep apnea of adult 01/06/2017    Social Hx   Social History   Socioeconomic History   Marital status: Married    Spouse name: Not on file   Number of children: Not on file   Years of education: Not on file   Highest education level: Not on file  Occupational History   Not on file  Tobacco Use   Smoking status: Never   Smokeless tobacco: Never  Substance and Sexual Activity   Alcohol use: No   Drug use: No   Sexual activity: Not on file  Other Topics Concern   Not on file  Social History Narrative   Lives at home with wife, daughter   Right handed   Caffeine: 1-2 cups coffee in the morning   Social Drivers of Health   Tobacco Use: Low Risk (12/04/2024)   Patient History    Smoking Tobacco Use: Never    Smokeless Tobacco Use: Never    Passive Exposure: Not on file  Financial Resource Strain: Not on file  Food Insecurity: Low Risk (10/07/2024)   Received from [facility]   Epic    Within the past 12 months, you worried that your food would run out before you got money to buy more: Never true    Within the past 12 months, the food you bought just didn't last  and you didn't have money to get more. : Never true  Transportation Needs: No Transportation Needs (10/07/2024)   Received from [facility]    In the past 12 months, has lack of reliable transportation kept you from medical appointments, meetings, work or from getting things needed for daily living? : No  Physical Activity: Not on file  Stress: Not on file  Social Connections: Not on file  Depression (PHQ2-9): Low Risk (12/04/2024)   Depression (PHQ2-9)    PHQ-2 Score: 0  Alcohol Screen: Not on file  Housing: Medium Risk (10/07/2024)   Received from [facility]   Epic    What is your living situation today?: I have a steady place to live    Think about the place you live. Do you have problems with any of the following? Choose all that apply:: None/None on this list;Lack of heat;Patient unable to answer  Utilities: Low Risk (10/07/2024)   Received from [facility]   Utilities    In the past 12 months has the electric, gas, oil, or water company threatened to shut off services in your home? : No  Health Literacy: Not on file    Review of Systems Per HPI  Objective:  BP ROLLEN)  143/96   Pulse 88   Temp 98.2 F (36.8 C)   Wt (!) 310 lb 12.8 oz (141 kg)   SpO2 96%   BMI 45.24 kg/m      12/04/2024    9:29 AM 07/31/2024   11:29 AM 07/31/2024   11:15 AM  BP/Weight  Systolic BP 143 135 121  Diastolic BP 96 98 87  Wt. (Lbs) 310.8    BMI 45.24 kg/m2      Physical Exam Vitals and nursing note reviewed.  Constitutional:      General: He is not in acute distress.    Appearance: He is obese.  HENT:     Head: Normocephalic and atraumatic.  Cardiovascular:     Rate and Rhythm: Normal rate and regular rhythm.  Pulmonary:     Effort: Pulmonary effort is normal. No respiratory distress.  Musculoskeletal:     Comments: Paraspinal musculature of the lumbar spine tender to palpation.  Right shoulder -positive Hawkins.  Neurological:     Mental Status: He is  alert.     Lab Results  Component Value Date   WBC 7.8 06/28/2024   HGB 16.7 06/28/2024   HCT 48.2 06/28/2024   PLT 311 06/28/2024   GLUCOSE 117 (H) 02/14/2024   CHOL 230 (H) 02/14/2024   TRIG 144 02/14/2024   HDL 40 02/14/2024   LDLCALC 164 (H) 02/14/2024   ALT 59 (H) 02/14/2024   AST 28 02/14/2024   NA 141 02/14/2024   K 4.7 02/14/2024   CL 104 02/14/2024   CREATININE 0.87 02/14/2024   BUN 13 02/14/2024   CO2 22 02/14/2024   TSH 3.430 02/14/2024   HGBA1C 6.2 (H) 02/14/2024     Assessment & Plan:  Acute bilateral low back pain, unspecified whether sciatica present Assessment & Plan: X-ray to rule out fracture.  Treating with diclofenac  and Zanaflex .  Orders: -     DG Lumbar Spine Complete -     Diclofenac  Sodium; Take 1 tablet (75 mg total) by mouth 2 (two) times daily.  Dispense: 30 tablet; Refill: 0 -     tiZANidine  HCl; Take 1 tablet (4 mg total) by mouth every 8 (eight) hours as needed for muscle spasms.  Dispense: 30 tablet; Refill: 0  Right shoulder pain, unspecified chronicity Assessment & Plan: X-ray for further evaluation.  Orders: -     DG Shoulder Right    Follow-up: Pending results  Gwendy Boeder Bluford DO [facility] "

## 2024-12-04 NOTE — Telephone Encounter (Signed)
 Copied from CRM #[id number]. Topic: Clinical - Lab/Test Results >> Dec 04, 2024  3:08 PM Ahlexyia S wrote: Reason for CRM: Pt called in requesting results from his imaging he had done at the hospital. Pt would like a callback when results have been reviewed.

## 2024-12-04 NOTE — Telephone Encounter (Signed)
 noted

## 2024-12-05 ENCOUNTER — Other Ambulatory Visit: Payer: Self-pay | Admitting: Family Medicine

## 2024-12-05 ENCOUNTER — Ambulatory Visit: Payer: Self-pay | Admitting: Family Medicine

## 2024-12-05 DIAGNOSIS — M545 Low back pain, unspecified: Secondary | ICD-10-CM

## 2024-12-05 DIAGNOSIS — M25511 Pain in right shoulder: Secondary | ICD-10-CM

## 2024-12-05 NOTE — Progress Notes (Signed)
 Pt called and he is aware referral was sent to [facility]

## 2024-12-05 NOTE — Progress Notes (Signed)
 Pt wants referral preferably to [facility] in [place]  print paperwork for pt about referral  Pt will come pick it up  Understand to call office back after 2 weeks if no word from referral

## 2024-12-05 NOTE — Telephone Encounter (Signed)
 Dr. Bluford ordered these

## 2024-12-10 ENCOUNTER — Telehealth: Payer: Self-pay

## 2024-12-10 NOTE — Telephone Encounter (Unsigned)
 Copied from CRM #[id number]. Topic: Clinical - Medical Advice >> Dec 10, 2024 11:47 AM Amber H wrote: Reason for CRM: Patient stated he threw up after taking tiZANidine  (ZANAFLEX ) 4 MG tablet and diclofenac  (VOLTAREN ) 75 MG EC tablet. He denied any other concerns but wanted to know if he should stop med or not.   Dawsyn- [phone]

## 2024-12-10 NOTE — Telephone Encounter (Signed)
 He may want to try taking them separately to see which one caused him to be sick

## 2024-12-11 NOTE — Telephone Encounter (Signed)
 Pt advised with verbal understanding

## 2024-12-11 NOTE — Telephone Encounter (Signed)
 Yes, he can take ibuprofen  and alternate with Tylenol .

## 2024-12-11 NOTE — Telephone Encounter (Signed)
 Recommend stopping the tizanidine  and diclofenac  all together.  Recommend elevating affected leg and increasing water intake.

## 2024-12-11 NOTE — Telephone Encounter (Signed)
 Noted, pt is advise, but as far as the pain goes can he take ibuprofen  for the pain?

## 2024-12-11 NOTE — Telephone Encounter (Signed)
 He stopped taking them yesterday and he states his foot completely swollen up where he's recovering from his ankle surgery he has called ortho also has a appointment with them about his back and shoulder but he dont know why it was affecting his foot.

## 2024-12-12 ENCOUNTER — Other Ambulatory Visit (HOSPITAL_COMMUNITY): Payer: Self-pay | Admitting: Orthopaedic Surgery

## 2024-12-12 ENCOUNTER — Other Ambulatory Visit (HOSPITAL_COMMUNITY): Payer: Self-pay

## 2024-12-12 ENCOUNTER — Ambulatory Visit (HOSPITAL_COMMUNITY)
Admission: RE | Admit: 2024-12-12 | Discharge: 2024-12-12 | Disposition: A | Payer: Worker's Compensation | Source: Ambulatory Visit | Attending: Vascular Surgery

## 2024-12-12 ENCOUNTER — Ambulatory Visit: Admitting: Pharmacist

## 2024-12-12 VITALS — BP 136/102 | HR 78

## 2024-12-12 DIAGNOSIS — I82441 Acute embolism and thrombosis of right tibial vein: Secondary | ICD-10-CM

## 2024-12-12 DIAGNOSIS — M7989 Other specified soft tissue disorders: Secondary | ICD-10-CM

## 2024-12-12 DIAGNOSIS — S86011A Strain of right Achilles tendon, initial encounter: Secondary | ICD-10-CM

## 2024-12-12 MED ORDER — APIXABAN 5 MG PO TABS: 5.0000 mg | ORAL_TABLET | Freq: Two times a day (BID) | ORAL | 1 refills | Status: AC

## 2024-12-12 MED ORDER — ELIQUIS DVT/PE STARTER PACK 5 MG PO TBPK
ORAL_TABLET | ORAL | 0 refills | Status: AC
  Filled 2024-12-12: qty 74, 30d supply, fill #0

## 2024-12-12 NOTE — Patient Instructions (Addendum)
-  Start apixaban  (Eliquis ) 10 mg twice daily for 7 days followed by 5 mg twice daily. -Your refills have been sent to [facility]. You may need to call the pharmacy to ask them to fill this when you start to run low on your current supply. Make sure you activate the Eliquis  copay card and take it with you to [facility]. This will reduce the cost of Eliquis  to $10 for a 1 month supply. -It is important to take your medication around the same time every day.  -Avoid NSAIDs like ibuprofen  (Advil , Motrin ) and naproxen (Aleve) as well as aspirin doses over 100 mg daily. -Tylenol  (acetaminophen ) is the preferred over the counter pain medication to lower the risk of bleeding. -Be sure to alert all of your health care providers that you are taking an anticoagulant prior to starting a new medication or having a procedure. -Monitor for signs and symptoms of bleeding (abnormal bruising, prolonged bleeding, nose bleeds, bleeding from gums, discolored urine, black tarry stools). If you have fallen and hit your head OR if your bleeding is severe or not stopping, seek emergency care.  -Go to the emergency room if emergent signs and symptoms of new clot occur (new or worse swelling and pain in an arm or leg, shortness of breath, chest pain, fast or irregular heartbeats, lightheadedness, dizziness, fainting, coughing up blood) or if you experience a significant color change (pale or blue) in the extremity that has the DVT.  -We recommend you wear compression stockings (20-30 mmHg) as long as you are having swelling or pain. Be sure to purchase the correct size and take them off at night.   If you have any questions or need to reschedule an appointment, please call [phone]. If you are having an emergency, call 911 or present to the nearest emergency room.   What is a DVT?  -Deep vein thrombosis (DVT) is a condition in which a blood clot forms in a vein of the deep venous system which can occur in the lower leg,  thigh, pelvis, arm, or neck. This condition is serious and can be life-threatening if the clot travels to the arteries of the lungs and causing a blockage (pulmonary embolism, PE). A DVT can also damage veins in the leg, which can lead to long-term venous disease, leg pain, swelling, discoloration, and ulcers or sores (post-thrombotic syndrome).  -Treatment may include taking an anticoagulant medication to prevent more clots from forming and the current clot from growing, wearing compression stockings, and/or surgical procedures to remove or dissolve the clot.

## 2024-12-12 NOTE — Progress Notes (Signed)
 " DVT Clinic Note  Name: Spencer Bowman     MRN: [id number]     DOB: Sep 11, 1976     Sex: male  PCP: Bevely Doffing, FNP  Today's Visit: Visit Information: Initial Visit  Referred to DVT Clinic by: Orthopedic Surgery - Dr. Barton Referred to CPP by: Dr. Lanis Reason for referral:  Chief Complaint  Patient presents with   DVT   HISTORY OF PRESENT ILLNESS: Spencer Bowman is a 49 y.o. male who presents after diagnosis of DVT for medication management. Patient underwent right achilles tendon repair surgery on 07/31/24 following a work related injury. After the surgery, patient reports his right lower leg was in a cast for a few months and he was non-weight bearing. After cast removal he still was not able to walk fully and began light exercises with his calf. Patient reports last week was the first week he was able to return to work. Reports having to walk a long distance to get from the parking lot to his seat at work. He also fell on the ice last week and injured his back and shoulder, also feels like he may have fallen on his right leg. He was seen at his primary care office and prescribed tizanidine  and diclofenac  for the injuries. With starting these medications he stopped taking aspirin 81 mg daily which he was on for DVT ppx post surgery. He also has now discontinued tizanidine  and diclofenac  due to nausea and an episode of vomiting. Patient reports that he has had calf pain and swelling since his surgery last September. Swelling got progressively worst last week after he returned to work and started walking more. Denies chest pain and SOB.  Positive Thrombotic Risk Factors: Paralysis, paresis, or recent plaster cast immobilization of lower extremity, Other (comment), Obesity (achilles tendon surgery on 07/31/24) Bleeding Risk Factors: None Present  Negative Thrombotic Risk Factors: Previous VTE, Recent surgery (within 3 months), Recent trauma (within 3 months), Recent admission to hospital  with acute illness (within 3 months), Paralysis, paresis, or recent plaster cast immobilization of lower extremity, Central venous catheterization, Pregnancy, Sedentary journey lasting >8 hours within 4 weeks, Bed rest >72 hours within 3 months, Within 6 weeks postpartum, Recent cesarean section (within 3 months), Estrogen therapy, Recent COVID diagnosis (within 3 months), Testosterone  therapy, Erythropoiesis-stimulating agent, Active cancer, Non-malignant, chronic inflammatory condition, Known thrombophilic condition, Smoking, Older age  Rx Insurance Coverage: Commercial Rx Affordability: Eliquis  is $327 for 30 days of Eliquis . He can use the copay card to reduce the cost to $10 per month. Rx Assistance Provided: Free 30-day trial card Co-pay card Preferred Pharmacy: Eliquis  starter pack filled at [facility]. Refills sent to [facility] per patient preference.  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   NASAL SEPTOPLASTY W/ TURBINOPLASTY Bilateral 01/06/2017   Procedure: NASAL SEPTOPLASTY WITH BILATERAL TURBINATE REDUCTION;  Surgeon: Lonni FORBES Angle,  MD;  Location: [facility] OR;  Service: ENT;  Laterality: Bilateral;   UVULOPALATOPHARYNGOPLASTY N/A 01/06/2017   Procedure: UVULOPALATOPHARYNGOPLASTY (UPPP);  Surgeon: Lonni FORBES Angle, MD;  Location: [facility] OR;  Service: ENT;  Laterality: N/A;    Social  History   Socioeconomic History   Marital status: Married    Spouse name: Not on file   Number of children: Not on file   Years of education: Not on file   Highest education level: Not on file  Occupational History   Not on file  Tobacco Use   Smoking status: Never   Smokeless tobacco: Never  Substance and Sexual Activity   Alcohol use: No   Drug use: No   Sexual activity: Not on file  Other Topics Concern   Not on file  Social History Narrative   Lives at home with wife, daughter   Right handed   Caffeine: 1-2 cups coffee in the morning   Social Drivers of Health   Tobacco Use: Low Risk (12/04/2024)   Patient History    Smoking Tobacco Use: Never    Smokeless Tobacco Use: Never    Passive Exposure: Not on file  Financial Resource Strain: Not on file  Food Insecurity: Low Risk (10/07/2024)   Received from [facility]   Epic    Within the past 12 months, you worried that your food would run out before you got money to buy more: Never true    Within the past 12 months, the food you bought just didn't last and you didn't have money to get more. : Never true  Transportation Needs: No Transportation Needs (10/07/2024)   Received from [facility]    In the past 12 months, has lack of reliable transportation kept you from medical appointments, meetings, work or from getting things needed for daily living? : No  Physical Activity: Not on file  Stress: Not on file  Social Connections: Not on file  Intimate Partner Violence: Not on file  Depression (PHQ2-9): Low Risk (12/04/2024)   Depression (PHQ2-9)    PHQ-2 Score: 0  Alcohol Screen: Not on file  Housing: Medium Risk (10/07/2024)   Received from [facility]   Epic    What is your living situation today?: I have a steady place to live    Think about the place you live. Do you have problems with any of the following? Choose all that apply:: None/None on this list;Lack of heat;Patient unable to answer   Utilities: Low Risk (10/07/2024)   Received from [facility]   Utilities    In the past 12 months has the electric, gas, oil, or water company threatened to shut off services in your home? : No  Health Literacy: Not on file    Family History  Family history unknown: Yes    Allergies as of 12/12/2024   (No Known Allergies)    Medications Ordered Prior to Encounter[1] REVIEW OF SYSTEMS:  Review of Systems  Respiratory:  Negative for shortness of breath.   Cardiovascular:  Positive for leg swelling. Negative for chest pain.   PHYSICAL EXAMINATION:  Vitals:   12/12/24 1403  BP: (!) 136/102  Pulse: 78  SpO2: 95%    There is no height or weight on file to calculate BMI.  Physical Exam Pulmonary:     Effort: Pulmonary effort is normal.  Musculoskeletal:        General: Tenderness present.     Right lower leg: Edema present.  Neurological:  Mental Status: He is alert.    Villalta Score for Post-Thrombotic Syndrome: Pain: Mild Cramps: Absent Heaviness: Absent Pruritus: Absent Pretibial Edema: Moderate Skin Induration: Absent Hyperpigmentation: Absent Redness: Absent Venous Ectasia: Absent Pain on calf compression: Mild Is venous ulcer present?: No If venous ulcer is present and score is <15, then 15 points total are assigned: Absent  LABS:  CBC     Component Value Date/Time   WBC 7.8 06/28/2024 1037   RBC 5.34 06/28/2024 1037   HGB 16.7 06/28/2024 1037   HGB 16.9 02/14/2024 0801   HCT 48.2 06/28/2024 1037   HCT 47.9 02/14/2024 0801   PLT 311 06/28/2024 1037   PLT 312 02/14/2024 0801   MCV 90.3 06/28/2024 1037   MCV 92 02/14/2024 0801   MCH 31.3 06/28/2024 1037   MCHC 34.6 06/28/2024 1037   RDW 12.7 06/28/2024 1037   RDW 12.3 02/14/2024 0801   LYMPHSABS 3.1 02/14/2024 0801   MONOABS 0.9 11/14/2007 1920   EOSABS 0.2 02/14/2024 0801   BASOSABS 0.1 02/14/2024 0801    Hepatic Function      Component Value Date/Time   PROT 6.7 02/14/2024 0801    ALBUMIN 4.5 02/14/2024 0801   AST 28 02/14/2024 0801   ALT 59 (H) 02/14/2024 0801   ALKPHOS 67 02/14/2024 0801   BILITOT 0.4 02/14/2024 0801    Renal Function   Lab Results  Component Value Date   CREATININE 0.87 02/14/2024   CREATININE 0.92 11/14/2007    CrCl cannot be calculated (Patient's most recent lab result is older than the maximum 21 days allowed.).   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    - All other veins visualized appear fully compressible and demonstrate  appropriate Doppler characteristics.   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 He has multiple risk factors for DVT including right achilles repair surgery, immobilization with cast and boot on right lower leg for several months after, and potential leg injury from fall last week. 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 His recent increase in activity may have led to acute worsening in swelling this past week. Will start Eliquis  VTE dosing. No concerns on labs for Eliquis . Plan to treat for 3 months for first provoked DVT. Extensively counseled on Eliquis  and all of his questions were answered. Measured him for compression stockings. Counseled to wear compression stockings and elevate his leg for swelling. No medication adherence barriers identified. No follow up imaging recommended for DVT.  PLAN: -Start apixaban  (Eliquis ) 10 mg twice daily for 7 days followed by 5 mg twice daily. -Expected duration of therapy: 3 months. Therapy started on  12/12/2024. -Patient educated on purpose, proper use and potential adverse effects of apixaban  (Eliquis ). -Discussed importance of taking medication around the same time every day. -Advised patient of medications to avoid (NSAIDs, aspirin doses >100 mg daily). -Educated that Tylenol  (acetaminophen ) is the preferred analgesic to lower the risk of bleeding. -Advised patient to alert all providers of anticoagulation therapy prior to starting a new medication or having a procedure. -Emphasized importance of  monitoring for signs and symptoms of bleeding (abnormal bruising, prolonged bleeding, nose bleeds, bleeding from gums, discolored urine, black tarry stools). -Educated patient to present to the ED if emergent signs and symptoms of new thrombosis occur. -Counseled patient to wear compression stockings daily, removing at night.  Follow up: DVT Clinic in 3 months  Izetta Henry, PharmD, CPP [facility] [phone]     [1]  Current Outpatient Medications on File Prior to Visit  Medication Sig Dispense Refill   Betaine, Trimethylglycine, (TMG, TRIMETHYLGLYCINE, PO) Take 1 capsule by mouth daily.     cyanocobalamin (VITAMIN B12) 1000 MCG tablet Take 1,000 mcg by mouth daily.     Multiple Vitamins-Minerals (MULTIVITAMIN WITH MINERALS) tablet Take 1 tablet by mouth daily.     diclofenac  (VOLTAREN ) 75 MG EC tablet Take 1 tablet (75 mg total) by mouth 2 (two) times daily. (Patient not taking: Reported on 12/12/2024) 30 tablet 0   tiZANidine  (ZANAFLEX ) 4 MG tablet Take 1 tablet (4 mg total) by mouth every 8 (eight) hours as needed for muscle spasms. (Patient not taking: Reported on 12/12/2024) 30 tablet 0   No current facility-administered medications on file prior to visit.   "

## 2025-01-16 ENCOUNTER — Encounter: Admitting: Adult Health

## 2025-02-05 ENCOUNTER — Ambulatory Visit

## 2025-03-06 ENCOUNTER — Ambulatory Visit: Admitting: Pharmacist
